# Patient Record
Sex: Female | Born: 1955 | Race: White | Hispanic: No | Smoking: Never smoker
Health system: Southern US, Community
[De-identification: ages and names within clinical notes are randomized; demographics above are authoritative.]

## PROBLEM LIST (undated history)

## (undated) DIAGNOSIS — J45909 Unspecified asthma, uncomplicated: Secondary | ICD-10-CM

## (undated) HISTORY — PX: TONSILLECTOMY: SUR1361

---

## 2019-11-04 ENCOUNTER — Other Ambulatory Visit: Payer: Self-pay

## 2019-11-04 ENCOUNTER — Emergency Department (HOSPITAL_COMMUNITY)
Admission: EM | Admit: 2019-11-04 | Discharge: 2019-11-04 | Disposition: A | Discharge status: Home / Self Care | Payer: BC Managed Care – PPO | Attending: Emergency Medicine | Admitting: Emergency Medicine

## 2019-11-04 ENCOUNTER — Encounter (HOSPITAL_COMMUNITY): Payer: Self-pay

## 2019-11-04 DIAGNOSIS — Y999 Unspecified external cause status: Secondary | ICD-10-CM | POA: Diagnosis not present

## 2019-11-04 DIAGNOSIS — Y9389 Activity, other specified: Secondary | ICD-10-CM | POA: Insufficient documentation

## 2019-11-04 DIAGNOSIS — Y929 Unspecified place or not applicable: Secondary | ICD-10-CM | POA: Diagnosis not present

## 2019-11-04 DIAGNOSIS — W208XXA Other cause of strike by thrown, projected or falling object, initial encounter: Secondary | ICD-10-CM | POA: Diagnosis not present

## 2019-11-04 DIAGNOSIS — R2241 Localized swelling, mass and lump, right lower limb: Secondary | ICD-10-CM | POA: Insufficient documentation

## 2019-11-04 DIAGNOSIS — M79671 Pain in right foot: Secondary | ICD-10-CM | POA: Insufficient documentation

## 2019-11-04 DIAGNOSIS — M79604 Pain in right leg: Secondary | ICD-10-CM | POA: Diagnosis not present

## 2019-11-04 DIAGNOSIS — M7989 Other specified soft tissue disorders: Secondary | ICD-10-CM

## 2019-11-04 HISTORY — DX: Unspecified asthma, uncomplicated: J45.909

## 2019-11-04 LAB — D-DIMER, QUANTITATIVE: D-Dimer, Quant: 0.36 ug/mL-FEU (ref 0.00–0.50)

## 2019-11-04 NOTE — ED Notes (Signed)
Pt verbalizes understanding of DC instructions. Pt belongings returned and is ambulatory out of ED.  

## 2019-11-04 NOTE — ED Triage Notes (Addendum)
Patient arrive POV.   [facility] went to [facility] and had xray and sent to ED to rule out blood clots on right leg with [place].   Pt dropped a box on her right foot last Wednesday. Pt reports swelling to right foot and continued pain. Patient reported a scrap on top of foot as well. Patient reports swelling is now going up her leg.   Pt reports she has been using a support sock with no releif.    Denies using OTC meds.   NKA per pt

## 2019-11-04 NOTE — ED Provider Notes (Signed)
[place] DEPT Provider Note   CSN: [id number] Arrival date & time: 11/04/19  1258     History Chief Complaint  Patient presents with  . Leg Pain    Michelle Werner is a 64 y.o. female.  HPI   Patient presents with concern of right leg swelling, right foot pain.  Patient is generally well, has a history of asthma, but was in her usual state of health until a few days ago.  4 days ago the patient dropped a box on her foot.  She notes that since that time she has had pain across the dorsal surface, midfoot.  She has not taken pain medication, but has noticed increasing swelling since the event.  Initially the swelling was about the foot, but has progressed to include the ankle and lower leg.  No pain in the calf, ankle, no chest pain, no dyspnea. Today, with concern for the swelling, she went to urgent care, there she was evaluated with x-ray which she reports was normal, not demonstrating fracture.  However, with the swelling, concern for DVT she was in here for evaluation.  Past Medical History:  Diagnosis Date  . Asthma     There are no problems to display for this patient.   Past Surgical History:  Procedure Laterality Date  . TONSILLECTOMY       OB History   No obstetric history on file.     No family history on file.  Social History   Tobacco Use  . Smoking status: Never Smoker  . Smokeless tobacco: Never Used  Substance Use Topics  . Alcohol use: Not Currently  . Drug use: Not on file    Home Medications Prior to Admission medications   Medication Sig Start Date End Date Taking? Authorizing Provider  cetirizine (ZYRTEC) 10 MG tablet Take 10 mg by mouth at bedtime.   Yes [provider]    Allergies    Patient has no known allergies.  Review of Systems   Review of Systems  Constitutional:       Per HPI, otherwise negative  HENT:       Per HPI, otherwise negative  Respiratory:       Per HPI, otherwise  negative  Cardiovascular:       Per HPI, otherwise negative  Gastrointestinal: Negative for vomiting.  Endocrine:       Negative aside from HPI  Genitourinary:       Neg aside from HPI   Musculoskeletal:       Per HPI, otherwise negative  Skin: Negative.   Neurological: Negative for syncope.    Physical Exam Updated Vital Signs BP 137/83   Pulse 80   Temp 98.7 F (37.1 C) (Oral)   Resp 18   SpO2 93%   Physical Exam Vitals and nursing note reviewed.  Constitutional:      General: She is not in acute distress.    Appearance: She is well-developed.  HENT:     Head: Normocephalic and atraumatic.  Eyes:     Conjunctiva/sclera: Conjunctivae normal.  Cardiovascular:     Rate and Rhythm: Normal rate and regular rhythm.  Pulmonary:     Effort: Pulmonary effort is normal. No respiratory distress.     Breath sounds: Normal breath sounds. No stridor.  Abdominal:     General: There is no distension.  Musculoskeletal:     Right lower leg: Edema present.       Legs:  Skin:  General: Skin is warm and dry.  Neurological:     Mental Status: She is alert and oriented to person, place, and time.     Cranial Nerves: No cranial nerve deficit.     ED Results / Procedures / Treatments   Labs (all labs ordered are listed, but only abnormal results are displayed) Labs Reviewed  D-DIMER, QUANTITATIVE (NOT AT [facility])     Procedures Procedures (including critical care time)  Medications Ordered in ED Medications - No data to display  ED Course  I have reviewed the triage vital signs and the nursing notes.  Pertinent labs & imaging results that were available during my care of the patient were reviewed by me and considered in my medical decision making (see chart for details).    MDM Rules/Calculators/A&P                      2:11 PM On repeat exam the patient is awake, alert, sitting upright, in no distress.  We discussed lab findings, reassuring with normal D-dimer, and  posttest probability of DVT following this normal result, low risk profile is negligible. We discussed possibilities for swelling including contusion, disruption of venous or lymphatic return or occult fracture of the foot. Patient comfortable with outpatient Ortho follow-up as needed.  With otherwise reassuring exam, labs, vitals, patient discharged in stable condition. Final Clinical Impression(s) / ED Diagnoses Final diagnoses:  Right leg swelling     Gerhard Munch, MD 11/04/19 1413

## 2019-11-04 NOTE — Discharge Instructions (Signed)
As discussed, your evaluation today has been largely reassuring.  But, it is important that you monitor your condition carefully, and do not hesitate to return to the ED if you develop new, or concerning changes in your condition. ? ?Otherwise, please follow-up with your physician for appropriate ongoing care. ? ?

## 2020-03-20 ENCOUNTER — Encounter (HOSPITAL_COMMUNITY): Payer: Self-pay | Admitting: General Surgery

## 2020-03-20 ENCOUNTER — Other Ambulatory Visit: Payer: Self-pay

## 2020-03-20 ENCOUNTER — Ambulatory Visit (HOSPITAL_COMMUNITY)
Admission: RE | Admit: 2020-03-20 | Discharge: 2020-03-20 | Disposition: A | Discharge status: Home / Self Care | Payer: BC Managed Care – PPO | Source: Ambulatory Visit | Attending: General Surgery | Admitting: General Surgery

## 2020-03-20 ENCOUNTER — Ambulatory Visit (HOSPITAL_COMMUNITY): Payer: BC Managed Care – PPO | Admitting: Anesthesiology

## 2020-03-20 ENCOUNTER — Emergency Department (HOSPITAL_COMMUNITY): Payer: PRIVATE HEALTH INSURANCE

## 2020-03-20 ENCOUNTER — Encounter (HOSPITAL_COMMUNITY)
Admission: RE | Disposition: A | Discharge status: Home / Self Care | Payer: Self-pay | Source: Ambulatory Visit | Attending: General Surgery

## 2020-03-20 ENCOUNTER — Emergency Department (HOSPITAL_COMMUNITY)
Admission: EM | Admit: 2020-03-20 | Discharge: 2020-03-20 | Disposition: A | Discharge status: Home / Self Care | Payer: PRIVATE HEALTH INSURANCE | Attending: Emergency Medicine | Admitting: Emergency Medicine

## 2020-03-20 DIAGNOSIS — J45909 Unspecified asthma, uncomplicated: Secondary | ICD-10-CM | POA: Insufficient documentation

## 2020-03-20 DIAGNOSIS — S52532A Colles' fracture of left radius, initial encounter for closed fracture: Secondary | ICD-10-CM | POA: Diagnosis not present

## 2020-03-20 DIAGNOSIS — W19XXXA Unspecified fall, initial encounter: Secondary | ICD-10-CM | POA: Insufficient documentation

## 2020-03-20 DIAGNOSIS — R110 Nausea: Secondary | ICD-10-CM | POA: Diagnosis not present

## 2020-03-20 DIAGNOSIS — Z20822 Contact with and (suspected) exposure to covid-19: Secondary | ICD-10-CM | POA: Insufficient documentation

## 2020-03-20 DIAGNOSIS — Y990 Civilian activity done for income or pay: Secondary | ICD-10-CM | POA: Diagnosis not present

## 2020-03-20 DIAGNOSIS — S52502A Unspecified fracture of the lower end of left radius, initial encounter for closed fracture: Secondary | ICD-10-CM | POA: Diagnosis not present

## 2020-03-20 DIAGNOSIS — S40922A Unspecified superficial injury of left upper arm, initial encounter: Secondary | ICD-10-CM | POA: Diagnosis present

## 2020-03-20 HISTORY — PX: ORIF WRIST FRACTURE: SHX2133

## 2020-03-20 LAB — RESPIRATORY PANEL BY RT PCR (FLU A&B, COVID)
Influenza A by PCR: NEGATIVE
Influenza B by PCR: NEGATIVE
SARS Coronavirus 2 by RT PCR: NEGATIVE

## 2020-03-20 SURGERY — OPEN REDUCTION INTERNAL FIXATION (ORIF) WRIST FRACTURE
Anesthesia: Choice | Site: Wrist | Laterality: Left

## 2020-03-20 SURGERY — OPEN REDUCTION INTERNAL FIXATION (ORIF) WRIST FRACTURE
Anesthesia: General | Site: Wrist | Laterality: Left

## 2020-03-20 MED ORDER — FENTANYL CITRATE (PF) 100 MCG/2ML IJ SOLN
INTRAMUSCULAR | Status: AC
  Administered 2020-03-20: 50 ug via INTRAVENOUS
  Filled 2020-03-20: qty 2

## 2020-03-20 MED ORDER — MIDAZOLAM HCL 2 MG/2ML IJ SOLN
INTRAMUSCULAR | Status: AC
  Filled 2020-03-20: qty 2

## 2020-03-20 MED ORDER — POVIDONE-IODINE 10 % EX SWAB: 2.0000 "application " | Freq: Once | CUTANEOUS | Status: DC

## 2020-03-20 MED ORDER — ONDANSETRON HCL 4 MG/2ML IJ SOLN
INTRAMUSCULAR | Status: AC
  Filled 2020-03-20: qty 2

## 2020-03-20 MED ORDER — DEXAMETHASONE SODIUM PHOSPHATE 10 MG/ML IJ SOLN
INTRAMUSCULAR | Status: DC | PRN
  Administered 2020-03-20: 4 mg via INTRAVENOUS

## 2020-03-20 MED ORDER — FENTANYL CITRATE (PF) 250 MCG/5ML IJ SOLN
INTRAMUSCULAR | Status: AC
Start: 2020-03-20 — End: ?
  Filled 2020-03-20: qty 5

## 2020-03-20 MED ORDER — 0.9 % SODIUM CHLORIDE (POUR BTL) OPTIME
TOPICAL | Status: DC | PRN
  Administered 2020-03-20: 1000 mL

## 2020-03-20 MED ORDER — MIDAZOLAM HCL 2 MG/2ML IJ SOLN
INTRAMUSCULAR | Status: AC
  Administered 2020-03-20: 1 mg via INTRAVENOUS
  Filled 2020-03-20: qty 2

## 2020-03-20 MED ORDER — FENTANYL CITRATE (PF) 100 MCG/2ML IJ SOLN
50.0000 ug | Freq: Once | INTRAMUSCULAR | Status: AC
  Administered 2020-03-20: 50 ug via INTRAVENOUS
  Filled 2020-03-20: qty 2

## 2020-03-20 MED ORDER — FENTANYL CITRATE (PF) 100 MCG/2ML IJ SOLN: 50.0000 ug | Freq: Once | INTRAMUSCULAR | Status: AC

## 2020-03-20 MED ORDER — BUPIVACAINE HCL (PF) 0.25 % IJ SOLN
INTRAMUSCULAR | Status: AC
  Filled 2020-03-20: qty 30

## 2020-03-20 MED ORDER — ONDANSETRON HCL 4 MG/2ML IJ SOLN
4.0000 mg | Freq: Once | INTRAMUSCULAR | Status: AC
  Administered 2020-03-20: 4 mg via INTRAVENOUS
  Filled 2020-03-20: qty 2

## 2020-03-20 MED ORDER — CEPHALEXIN 500 MG PO CAPS: 500.0000 mg | ORAL_CAPSULE | Freq: Four times a day (QID) | ORAL | 0 refills | Status: DC

## 2020-03-20 MED ORDER — LIDOCAINE 2% (20 MG/ML) 5 ML SYRINGE
INTRAMUSCULAR | Status: DC | PRN
  Administered 2020-03-20: 60 mg via INTRAVENOUS

## 2020-03-20 MED ORDER — CHLORHEXIDINE GLUCONATE 0.12 % MT SOLN: 15.0000 mL | OROMUCOSAL | Status: AC

## 2020-03-20 MED ORDER — LIDOCAINE 2% (20 MG/ML) 5 ML SYRINGE
INTRAMUSCULAR | Status: AC
  Filled 2020-03-20: qty 5

## 2020-03-20 MED ORDER — HYDROCODONE-ACETAMINOPHEN 5-325 MG PO TABS
1.0000 | ORAL_TABLET | Freq: Four times a day (QID) | ORAL | 0 refills | Status: AC | PRN
Start: 2020-03-20 — End: 2020-03-27

## 2020-03-20 MED ORDER — MIDAZOLAM HCL 2 MG/2ML IJ SOLN: 1.0000 mg | Freq: Once | INTRAMUSCULAR | Status: AC

## 2020-03-20 MED ORDER — ONDANSETRON HCL 4 MG/2ML IJ SOLN
INTRAMUSCULAR | Status: DC | PRN
  Administered 2020-03-20: 4 mg via INTRAVENOUS

## 2020-03-20 MED ORDER — FENTANYL CITRATE (PF) 100 MCG/2ML IJ SOLN
75.0000 ug | Freq: Once | INTRAMUSCULAR | Status: AC
  Administered 2020-03-20: 75 ug via INTRAVENOUS
  Filled 2020-03-20: qty 2

## 2020-03-20 MED ORDER — PROPOFOL 10 MG/ML IV BOLUS
INTRAVENOUS | Status: AC
  Filled 2020-03-20: qty 20

## 2020-03-20 MED ORDER — DEXAMETHASONE SODIUM PHOSPHATE 10 MG/ML IJ SOLN
INTRAMUSCULAR | Status: AC
  Filled 2020-03-20: qty 1

## 2020-03-20 MED ORDER — ROPIVACAINE HCL 7.5 MG/ML IJ SOLN
INTRAMUSCULAR | Status: DC | PRN
  Administered 2020-03-20: 20 mL via PERINEURAL

## 2020-03-20 MED ORDER — CHLORHEXIDINE GLUCONATE 0.12 % MT SOLN
OROMUCOSAL | Status: AC
  Administered 2020-03-20: 15 mL via OROMUCOSAL
  Filled 2020-03-20: qty 15

## 2020-03-20 MED ORDER — PROPOFOL 10 MG/ML IV BOLUS
INTRAVENOUS | Status: DC | PRN
  Administered 2020-03-20: 150 mg via INTRAVENOUS

## 2020-03-20 MED ORDER — CHLORHEXIDINE GLUCONATE 4 % EX LIQD: 60.0000 mL | Freq: Once | CUTANEOUS | Status: DC

## 2020-03-20 MED ORDER — CEFAZOLIN SODIUM-DEXTROSE 2-4 GM/100ML-% IV SOLN
2.0000 g | INTRAVENOUS | Status: AC
  Administered 2020-03-20: 2 g via INTRAVENOUS

## 2020-03-20 MED ORDER — CEFAZOLIN SODIUM-DEXTROSE 2-4 GM/100ML-% IV SOLN
INTRAVENOUS | Status: AC
  Filled 2020-03-20: qty 100

## 2020-03-20 MED ORDER — LACTATED RINGERS IV SOLN: INTRAVENOUS | Status: DC

## 2020-03-20 SURGICAL SUPPLY — 56 items
BIT DRILL 2.0 LNG QUCK RELEASE (BIT) ×1 IMPLANT
BIT DRILL QC 2.8X5 (BIT) ×2 IMPLANT
BNDG ELASTIC 3X5.8 VLCR STR LF (GAUZE/BANDAGES/DRESSINGS) ×2 IMPLANT
BNDG ELASTIC 4X5.8 VLCR STR LF (GAUZE/BANDAGES/DRESSINGS) ×2 IMPLANT
BNDG ESMARK 4X9 LF (GAUZE/BANDAGES/DRESSINGS) IMPLANT
BNDG GAUZE ELAST 4 BULKY (GAUZE/BANDAGES/DRESSINGS) ×2 IMPLANT
CANISTER SUCT 3000ML PPV (MISCELLANEOUS) ×2 IMPLANT
CHLORAPREP W/TINT 26 (MISCELLANEOUS) ×2 IMPLANT
CORD BIPOLAR FORCEPS 12FT (ELECTRODE) ×2 IMPLANT
COVER SURGICAL LIGHT HANDLE (MISCELLANEOUS) ×2 IMPLANT
COVER WAND RF STERILE (DRAPES) ×2 IMPLANT
CUFF TOURN SGL QUICK 18X4 (TOURNIQUET CUFF) ×2 IMPLANT
CUFF TOURN SGL QUICK 24 (TOURNIQUET CUFF)
CUFF TRNQT CYL 24X4X16.5-23 (TOURNIQUET CUFF) IMPLANT
DRAIN TLS ROUND 10FR (DRAIN) IMPLANT
DRAPE OEC MINIVIEW 54X84 (DRAPES) ×2 IMPLANT
DRAPE SURG 17X23 STRL (DRAPES) ×2 IMPLANT
DRILL 2.0 LNG QUICK RELEASE (BIT) ×2
GAUZE XEROFORM 1X8 LF (GAUZE/BANDAGES/DRESSINGS) ×2 IMPLANT
GLOVE BIOGEL M 8.0 STRL (GLOVE) ×2 IMPLANT
GOWN STRL REUS W/ TWL XL LVL3 (GOWN DISPOSABLE) ×2 IMPLANT
GOWN STRL REUS W/TWL XL LVL3 (GOWN DISPOSABLE) ×2
GUIDEWIRE ORTHO 0.054X6 (WIRE) ×4 IMPLANT
KIT BASIN OR (CUSTOM PROCEDURE TRAY) ×2 IMPLANT
NEEDLE HYPO 22GX1.5 SAFETY (NEEDLE) IMPLANT
NS IRRIG 1000ML POUR BTL (IV SOLUTION) ×2 IMPLANT
PACK ORTHO EXTREMITY (CUSTOM PROCEDURE TRAY) ×2 IMPLANT
PAD CAST 3X4 CTTN HI CHSV (CAST SUPPLIES) IMPLANT
PAD CAST 4YDX4 CTTN HI CHSV (CAST SUPPLIES) ×1 IMPLANT
PADDING CAST ABS 4INX4YD NS (CAST SUPPLIES) ×1
PADDING CAST ABS COTTON 4X4 ST (CAST SUPPLIES) ×1 IMPLANT
PADDING CAST COTTON 3X4 STRL (CAST SUPPLIES)
PADDING CAST COTTON 4X4 STRL (CAST SUPPLIES) ×1
PLATE LEFT DIST RADIUS NARROW (Plate) ×2 IMPLANT
SCREW BN FT 16X2.3XLCK HEX CRT (Screw) ×1 IMPLANT
SCREW CORT FT 18X2.3XLCK HEX (Screw) ×4 IMPLANT
SCREW CORT FT 20X2.3XLCK HEX (Screw) ×1 IMPLANT
SCREW CORTICAL LOCKING 2.3X16M (Screw) ×1 IMPLANT
SCREW CORTICAL LOCKING 2.3X18M (Screw) ×4 IMPLANT
SCREW CORTICAL LOCKING 2.3X20M (Screw) ×1 IMPLANT
SCREW HEXALOBE NON-LOCK 3.5X14 (Screw) ×6 IMPLANT
SPLINT PLASTER CAST XFAST 4X15 (CAST SUPPLIES) IMPLANT
SPLINT PLASTER XTRA FAST SET 4 (CAST SUPPLIES)
SUT ETHILON 3 0 PS 1 (SUTURE) ×2 IMPLANT
SUT ETHILON 4 0 PS 2 18 (SUTURE) ×2 IMPLANT
SUT MNCRL AB 4-0 PS2 18 (SUTURE) ×2 IMPLANT
SUT VIC AB 3-0 PS1 18 (SUTURE) ×2
SUT VIC AB 3-0 PS1 18X BRD (SUTURE) ×1 IMPLANT
SUT VIC AB 3-0 PS1 18XBRD (SUTURE) ×1 IMPLANT
SUT VICRYL 4-0 PS2 18IN ABS (SUTURE) ×2 IMPLANT
SYR BULB EAR ULCER 3OZ GRN STR (SYRINGE) ×2 IMPLANT
SYR CONTROL 10ML LL (SYRINGE) IMPLANT
TOWEL GREEN STERILE FF (TOWEL DISPOSABLE) ×2 IMPLANT
TUBE CONNECTING 20X1/4 (TUBING) ×2 IMPLANT
UNDERPAD 30X36 HEAVY ABSORB (UNDERPADS AND DIAPERS) ×2 IMPLANT
WATER STERILE IRR 1000ML POUR (IV SOLUTION) ×2 IMPLANT

## 2020-03-20 NOTE — Consult Note (Deleted)
  The note originally documented on this encounter has been moved the the encounter in which it belongs.  

## 2020-03-20 NOTE — Anesthesia Procedure Notes (Signed)
Anesthesia Regional Block: Supraclavicular block   Pre-Anesthetic Checklist: ,, timeout performed, Correct Patient, Correct Site, Correct Laterality, Correct Procedure, Correct Position, site marked, Risks and benefits discussed,  Surgical consent,  Pre-op evaluation,  At surgeon's request and post-op pain management  Laterality: Left  Prep: chloraprep       Needles:  Injection technique: Single-shot  Needle Type: Echogenic Needle     Needle Length: 5cm  Needle Gauge: 21     Additional Needles:   Narrative:  Start time: 03/20/2020 3:40 PM End time: 03/20/2020 3:43 PM Injection made incrementally with aspirations every 5 mL.  Performed by: Personally  Anesthesiologist: Beryle Lathe, MD  Additional Notes: No pain on injection. No increased resistance to injection. Injection made in 5cc increments. Good needle visualization. Patient tolerated the procedure well.

## 2020-03-20 NOTE — ED Provider Notes (Signed)
[facility]-EMERGENCY DEPT Provider Note   CSN: [id number] Arrival date & time: 03/20/20  9924     History Chief Complaint  Patient presents with  . Fall  . Wrist Pain    left    Michelle Werner is a 64 y.o. female with no pertinent past medical history presents the emergency department today for fall.  Patient states that she was pushing cart at work at [employer], she had mechanical fall and fell on her wrist.  Has never broken her wrist before.  States that she is generally healthy.  No blood thinners.  States that she is having pain in her wrist that radiates down into her fingers.  Mild numbness and tingling into her fingers.  Arrived by EMS, they were able to splint this.  States that pain is an 8/10.  Admits to some nausea.  No vomiting.  States that she was in normal health yesterday.  Did not hit her head, no headache, loss of consciousness.  No neck pain.  Does not take any medications currently.  HPI     Past Medical History:  Diagnosis Date  . Asthma     There are no problems to display for this patient.   Past Surgical History:  Procedure Laterality Date  . TONSILLECTOMY       OB History   No obstetric history on file.     No family history on file.  Social History   Tobacco Use  . Smoking status: Never Smoker  . Smokeless tobacco: Never Used  Substance Use Topics  . Alcohol use: Not Currently  . Drug use: Not on file    Home Medications Prior to Admission medications   Medication Sig Start Date End Date Taking? Authorizing Provider  levocetirizine (XYZAL) 5 MG tablet Take 5 mg by mouth at bedtime. 02/14/20  Yes [provider]    Allergies    Patient has no known allergies.  Review of Systems   Review of Systems  Constitutional: Negative for chills, diaphoresis, fatigue and fever.  HENT: Negative for congestion, sore throat and trouble swallowing.   Eyes: Negative for pain and visual disturbance.  Respiratory:  Negative for cough, shortness of breath and wheezing.   Cardiovascular: Negative for chest pain, palpitations and leg swelling.  Gastrointestinal: Positive for nausea. Negative for abdominal distention, abdominal pain, diarrhea and vomiting.  Genitourinary: Negative for difficulty urinating.  Musculoskeletal: Positive for arthralgias. Negative for back pain, neck pain and neck stiffness.  Skin: Negative for pallor.  Neurological: Negative for dizziness, speech difficulty, weakness and headaches.  Psychiatric/Behavioral: Negative for confusion.    Physical Exam Updated Vital Signs BP 135/81 (BP Location: Right Arm)   Pulse 70   Temp 97.7 F (36.5 C) (Oral)   Resp (!) 24   Ht 5\' 4"  (1.626 m)   Wt 68 kg   SpO2 99%   BMI 25.75 kg/m   Physical Exam Constitutional:      General: She is not in acute distress.    Appearance: Normal appearance. She is not ill-appearing, toxic-appearing or diaphoretic.  HENT:     Mouth/Throat:     Mouth: Mucous membranes are moist.     Pharynx: Oropharynx is clear.  Eyes:     General: No scleral icterus.    Extraocular Movements: Extraocular movements intact.     Pupils: Pupils are equal, round, and reactive to light.  Cardiovascular:     Rate and Rhythm: Normal rate and regular rhythm.  Pulses: Normal pulses.     Heart sounds: Normal heart sounds.  Pulmonary:     Effort: Pulmonary effort is normal. No respiratory distress.     Breath sounds: Normal breath sounds. No stridor. No wheezing, rhonchi or rales.  Chest:     Chest wall: No tenderness.  Abdominal:     General: Abdomen is flat. There is no distension.     Palpations: Abdomen is soft.     Tenderness: There is no abdominal tenderness. There is no guarding or rebound.  Musculoskeletal:        General: No swelling or tenderness. Normal range of motion.     Cervical back: Normal range of motion and neck supple. No rigidity.     Right lower leg: No edema.     Left lower leg: No edema.       Comments: Patient with obvious deformity to left wrist, no open fractures noted.  No overlying erythema, warmth or ecchymosis.  Patient unable to move wrist limited to pain.  Normal sensation of wrist and fingers.  Normal strength of fingers.  Radial pulse 2+.  Normal cap refill.  Skin:    General: Skin is warm and dry.     Capillary Refill: Capillary refill takes less than 2 seconds.     Coloration: Skin is not pale.  Neurological:     General: No focal deficit present.     Mental Status: She is alert and oriented to person, place, and time.  Psychiatric:        Mood and Affect: Mood normal.        Behavior: Behavior normal.     ED Results / Procedures / Treatments   Labs (all labs ordered are listed, but only abnormal results are displayed) Labs Reviewed  RESPIRATORY PANEL BY RT PCR (FLU A&B, COVID)    EKG None  Radiology DG Forearm Left  Result Date: 03/20/2020 CLINICAL DATA:  Fall, wrist deformity EXAM: LEFT FOREARM - 2 VIEW COMPARISON:  Wrist series today FINDINGS: There is a comminuted fracture through the distal left radius with posterior displacement and angulation. No visible ulnar abnormality. No subluxation or dislocation. IMPRESSION: Comminuted, posteriorly displaced and angulated distal left radial metaphyseal fracture. Electronically Signed   By: Charlett Nose M.D.   On: 03/20/2020 11:09   DG Wrist Complete Left  Result Date: 03/20/2020 CLINICAL DATA:  Fall, wrist deformity EXAM: LEFT WRIST - COMPLETE 3+ VIEW COMPARISON:  Forearm today FINDINGS: There is a comminuted distal left radial fracture. Posterior angulation and displacement of fracture fragments. No subluxation or dislocation. No acute ulnar abnormality. Rounded bone density adjacent to the ulnar styloid is well corticated and likely related to old injury. IMPRESSION: Comminuted, posteriorly displaced and angulated distal left radial fracture. Electronically Signed   By: Charlett Nose M.D.   On: 03/20/2020  11:09    Procedures Procedures (including critical care time)  Medications Ordered in ED Medications  fentaNYL (SUBLIMAZE) injection 75 mcg (75 mcg Intravenous Given 03/20/20 1034)  ondansetron (ZOFRAN) injection 4 mg (4 mg Intravenous Given 03/20/20 1035)  fentaNYL (SUBLIMAZE) injection 50 mcg (50 mcg Intravenous Given 03/20/20 1307)    ED Course  I have reviewed the triage vital signs and the nursing notes.  Pertinent labs & imaging results that were available during my care of the patient were reviewed by me and considered in my medical decision making (see chart for details).    MDM Rules/Calculators/A&P  Michelle Werner is a 64 y.o. female with no pertinent past medical history presents the emergency department today for mechanical fall.  Did not hit her head, no loss of conscious.  Obvious deformity to left wrist, patient is distally neurovascularly intact.  Will order plain films and pain control this time.  Plain films with comminuted displaced and angulated distal left radial fracture.  Appears has left wrist has been completely shattered, will call hand at this time.  Closed fracture.  Spoke to Earney Hamburg, PA-C who spoke to Dr. Izora Ribas who states that they will do surgery on this patient for today.  States that this can be technically an outpatient surgery, to discharge from [facility] and to have patient come to [facility] around 4 as outpatient.  Earney Hamburg is currently speaking to patient in the room.  Patient has been splinted appropriately with sugar tong splint, pain under control and to be discharged to have surgery later today.  Patient is agreeable with this plan.  Patient states daughter can drive her.  Last time she ate something was at 530 this morning.  Did advise patient to not eat or drink anything until after the surgery, patient agreeable.  Patient appears extremely reasonable.  Doubt need for further emergent work up at this time.  I explained the diagnosis and have given explicit precautions to return to the ER including for any other new or worsening symptoms. The patient understands and accepts the medical plan as it's been dictated and I have answered their questions.   I discussed this case with my attending physician who cosigned this note including patient's presenting symptoms, physical exam, and planned diagnostics and interventions. Attending physician stated agreement with plan or made changes to plan which were implemented.   Attending physician assessed patient at bedside.  Final Clinical Impression(s) / ED Diagnoses Final diagnoses:  Closed Colles' fracture of left radius, initial encounter    Rx / DC Orders ED Discharge Orders    None       Farrel Gordon, PA-C 03/20/20 1442    Wynetta Fines, MD 03/23/20 1700

## 2020-03-20 NOTE — ED Notes (Signed)
Notified daughter about d/c to [facility] for surgery. She said she is coming ASAP

## 2020-03-20 NOTE — Discharge Instructions (Addendum)
Please go to [facility], have someone drive you at 703, they will do your surgery around 4 PM.  Please go to the admitting area at [facility] to check in.  Do not eat or drink anything on the way.  Please follow Casimiro Needle Jeffrey's instructions.  You can use the attached instructions.  They will give you information and instructions post surgery.  Please come back to the emerge department for any new worsening concerning symptoms.  Do not drive.

## 2020-03-20 NOTE — Anesthesia Procedure Notes (Addendum)
Procedure Name: LMA Insertion Date/Time: 03/20/2020 4:30 PM Performed by: Shireen Quan, CRNA Pre-anesthesia Checklist: Patient identified, Emergency Drugs available, Suction available and Patient being monitored Patient Re-evaluated:Patient Re-evaluated prior to induction Oxygen Delivery Method: Circle System Utilized Preoxygenation: Pre-oxygenation with 100% oxygen Induction Type: IV induction Ventilation: Mask ventilation without difficulty LMA: LMA inserted LMA Size: 4.0 Number of attempts: 1 Airway Equipment and Method: Bite block Placement Confirmation: positive ETCO2 Tube secured with: Tape Dental Injury: Teeth and Oropharynx as per pre-operative assessment

## 2020-03-20 NOTE — Consult Note (Signed)
Reason for Consult:Left wrist fx Referring Physician: P Messick  Michelle Werner is an 64 y.o. female.  HPI: Michelle Werner was working at [employer] when they struck a curb. This caused her to lose her balance and she fell backwards onto her left outstretched hand. She had immediate left wrist pain. She was brought to the ED where x-rays showed a distal radius fx and hand surgery was consulted. She is RHD.  Past Medical History:  Diagnosis Date  . Asthma     Past Surgical History:  Procedure Laterality Date  . TONSILLECTOMY      No family history on file.  Social History:  reports that she has never smoked. She has never used smokeless tobacco. She reports previous alcohol use. No history on file for drug use.  Allergies: No Known Allergies  Medications: I have reviewed the patient's current medications.  No results found for this or any previous visit (from the past 48 hour(s)).  DG Forearm Left  Result Date: 03/20/2020 CLINICAL DATA:  Fall, wrist deformity EXAM: LEFT FOREARM - 2 VIEW COMPARISON:  Wrist series today FINDINGS: There is a comminuted fracture through the distal left radius with posterior displacement and angulation. No visible ulnar abnormality. No subluxation or dislocation. IMPRESSION: Comminuted, posteriorly displaced and angulated distal left radial metaphyseal fracture. Electronically Signed   By: Charlett Nose M.D.   On: 03/20/2020 11:09   DG Wrist Complete Left  Result Date: 03/20/2020 CLINICAL DATA:  Fall, wrist deformity EXAM: LEFT WRIST - COMPLETE 3+ VIEW COMPARISON:  Forearm today FINDINGS: There is a comminuted distal left radial fracture. Posterior angulation and displacement of fracture fragments. No subluxation or dislocation. No acute ulnar abnormality. Rounded bone density adjacent to the ulnar styloid is well corticated and likely related to old injury. IMPRESSION: Comminuted, posteriorly displaced and angulated distal left radial fracture.  Electronically Signed   By: Charlett Nose M.D.   On: 03/20/2020 11:09    Review of Systems  HENT: Negative for ear discharge, ear pain, hearing loss and tinnitus.   Eyes: Negative for photophobia and pain.  Respiratory: Negative for cough and shortness of breath.   Cardiovascular: Negative for chest pain.  Gastrointestinal: Negative for abdominal pain, nausea and vomiting.  Genitourinary: Negative for dysuria, flank pain, frequency and urgency.  Musculoskeletal: Positive for arthralgias (Left wrist). Negative for back pain, myalgias and neck pain.  Neurological: Negative for dizziness and headaches.  Hematological: Does not bruise/bleed easily.  Psychiatric/Behavioral: The patient is not nervous/anxious.    Blood pressure 123/73, pulse 70, temperature 97.8 F (36.6 C), temperature source Oral, resp. rate 19, height 5\' 4"  (1.626 m), weight 68 kg, SpO2 97 %. Physical Exam Constitutional:      General: She is not in acute distress.    Appearance: She is well-developed. She is not diaphoretic.  HENT:     Head: Normocephalic and atraumatic.  Eyes:     General: No scleral icterus.       Right eye: No discharge.        Left eye: No discharge.     Conjunctiva/sclera: Conjunctivae normal.  Cardiovascular:     Rate and Rhythm: Normal rate and regular rhythm.  Pulmonary:     Effort: Pulmonary effort is normal. No respiratory distress.  Musculoskeletal:     Cervical back: Normal range of motion.     Comments: Left shoulder, elbow, wrist, digits- no skin wounds, sugar tong splint in place, no instability, no blocks to motion  Sens  Ax/R/M/U intact  Mot   Ax/ R/ PIN/ M/ AIN/ U grossly intact  Fingers perfused  Skin:    General: Skin is warm and dry.  Neurological:     Mental Status: She is alert.  Psychiatric:        Behavior: Behavior normal.     Assessment/Plan: Left wrist fx -- Plan ORIF today by Dr. Izora Ribas at [facility]. Please keep NPO. Pt's daughter will transport to [facility]. Anticipate  discharge after surgery. Asthma    Freeman Caldron, PA-C Orthopedic Surgery [phone] 03/20/2020, 1:31 PM

## 2020-03-20 NOTE — Anesthesia Preprocedure Evaluation (Addendum)
Anesthesia Evaluation  Patient identified by MRN, date of birth, ID band Patient awake    Reviewed: Allergy & Precautions, NPO status , Patient's Chart, lab work & pertinent test results  History of Anesthesia Complications Negative for: history of anesthetic complications  Airway Mallampati: III  TM Distance: >3 FB Neck ROM: Full    Dental  (+) Dental Advisory Given   Pulmonary asthma ,    Pulmonary exam normal        Cardiovascular negative cardio ROS Normal cardiovascular exam     Neuro/Psych negative neurological ROS  negative psych ROS   GI/Hepatic negative GI ROS, Neg liver ROS,   Endo/Other  negative endocrine ROS  Renal/GU negative Renal ROS     Musculoskeletal negative musculoskeletal ROS (+)   Abdominal   Peds  Hematology negative hematology ROS (+)   Anesthesia Other Findings Covid test negative   Reproductive/Obstetrics                            Anesthesia Physical Anesthesia Plan  ASA: II  Anesthesia Plan: General   Post-op Pain Management:  Regional for Post-op pain   Induction: Intravenous  PONV Risk Score and Plan: 3 and Treatment may vary due to age or medical condition, Ondansetron, Dexamethasone and Midazolam  Airway Management Planned: LMA  Additional Equipment: None  Intra-op Plan:   Post-operative Plan: Extubation in OR  Informed Consent: I have reviewed the patients History and Physical, chart, labs and discussed the procedure including the risks, benefits and alternatives for the proposed anesthesia with the patient or authorized representative who has indicated his/her understanding and acceptance.     Dental advisory given  Plan Discussed with: CRNA and Anesthesiologist  Anesthesia Plan Comments:        Anesthesia Quick Evaluation

## 2020-03-20 NOTE — Transfer of Care (Signed)
Immediate Anesthesia Transfer of Care Note  Patient: Michelle Werner  Procedure(s) Performed: OPEN REDUCTION INTERNAL FIXATION (ORIF) WRIST FRACTURE (Left Wrist)  Patient Location: PACU  Anesthesia Type:GA combined with regional for post-op pain  Level of Consciousness: awake and patient cooperative  Airway & Oxygen Therapy: Patient Spontanous Breathing and Patient connected to nasal cannula oxygen  Post-op Assessment: Report given to RN, Post -op Vital signs reviewed and stable and Patient moving all extremities  Post vital signs: Reviewed and stable  Last Vitals:  Vitals Value Taken Time  BP 134/77 03/20/20 1727  Temp    Pulse 70 03/20/20 1729  Resp 13 03/20/20 1729  SpO2 100 % 03/20/20 1729  Vitals shown include unvalidated device data.  Last Pain:  Vitals:   03/20/20 1556  TempSrc:   PainSc: 0-No pain         Complications: No complications documented.

## 2020-03-20 NOTE — Discharge Instructions (Signed)
Discharge Instructions:  Keep your dressing clean, dry and in place until instructed to remove by Dr. .  If the dressing becomes dirty or wet call the office for instructions during business hours. Elevate the extremity to help with swelling, this will also help with any discomfort. Take your medication as prescribed. No lifting with the injured  extremity. If you feel that the dressing is too tight, you may loosen it, but keep it on; finger tips should be pink; if there is a concern, call the office. [phone] Ice may be used if the injury is a fracture, do not apply ice directly to the skin. Please call the office on the next business day after discharge to arrange a follow up appointment.  Call [phone] between the hours of 9am - 5pm M-Th or 9am - 1pm on Fri. For most hand injuries and/or conditions, you may return to work using the uninjured hand (one handed duty) within 24-72 hours.  A detailed note will be provided to you at your follow up appointment or may contact the office prior to your follow up.    

## 2020-03-20 NOTE — ED Notes (Signed)
I updated her daughter on the plan of care at this point, pending x ray results.

## 2020-03-20 NOTE — Op Note (Signed)
NAME: Michelle Werner, HEAD MEDICAL RECORD LX:[id number] ACCOUNT [id number] DATE OF BIRTH:01-03-1956 FACILITY: [facility] LOCATION: [facility] PHYSICIAN: C. , MD  OPERATIVE REPORT  DATE OF PROCEDURE:  03/20/2020  PREOPERATIVE DIAGNOSIS:  Closed displaced fracture of her left distal radius.  POSTOPERATIVE DIAGNOSIS:  Closed displaced fracture of her left distal radius.  PROCEDURE:  Open reduction internal fixation of the left distal radius fracture with an Acumed volar plate.  SURGEON:  Knute Neu, MD  INDICATIONS:  The patient is a 64 year old female who fell at work onto an outstretched hand sustaining a fracture to her left wrist.  She was seen and evaluated.  Surgical intervention was recommended.  Risks, benefits and alternatives of surgery were  discussed with her.  She agreed with this course of action.  Consent was obtained.  DESCRIPTION OF PROCEDURE:  The patient was taken to the operating room and placed supine on the operating table.  Anesthesia was administered.  Prior to coming back to the operating room, the anesthesia service provided an upper extremity block.  The arm  was prepped and draped in normal sterile fashion.  A tourniquet was used on the upper arm, exsanguinated and inflated to 250 mmHg.  An incision was made on the volar wrist overlying the FCR tendon.  Dissection was carried down through the fascia to the  pronator quadratus muscle.  The muscle was taken down in a hockey stick type fashion, exposing the fracture site.  The fracture site was comminuted and distal.  Adequate exposure was obtained and an appropriate sized Acumed volar plate was chosen,  temporarily held in place with K-wires where x-ray revealed a near anatomic reduction and good plate placement.  Afterwards each of the 4 radial shaft screws were each drilled, measured and appropriate sized cortical screws were placed.  Afterwards, each  of the distal radial screws were each drilled, measured and  appropriate sized screws placed.  Afterwards several x-ray views, both AP, lateral and tangential revealed near anatomic reduction, good plate placement with good screw length.  No screws were  in the joint.  Afterwards, the wound was irrigated with irrigation solution.  The deep fascia was closed with interrupted 3-0 Vicryl.  The subcutaneous tissues were closed with 3-0 Vicryl and the skin was closed with a running 4-0 Monocryl.  Xeroform  gauze and a sterile dressing as well as a volar splint were placed.  After the tourniquet was released, the fingers returned to a nice pink color.  VN/NUANCE  D:03/20/2020 T:03/20/2020 JOB:[id number]

## 2020-03-20 NOTE — ED Triage Notes (Signed)
Pt came from work via EMS. C/c: fall with left wrist pain Fell backwards on Left wrist d/t kickback from shopping carts. Broke her fall with the left hand. Pain 8/10 in left wrist.  Denies back or neck pain.  Neuro check normal, peripheral pulses present. A&O x4

## 2020-03-20 NOTE — ED Notes (Signed)
Leaving 20 g IV in Right AC for surgery at [facility] at time of d/c from [facility] ED. Going to wrap it in [place] for travel to [facility]. Instructed pt to not do anything to the IV or mess with it at all

## 2020-03-24 ENCOUNTER — Encounter (HOSPITAL_COMMUNITY): Payer: Self-pay | Admitting: General Surgery

## 2020-03-24 NOTE — Anesthesia Postprocedure Evaluation (Signed)
Anesthesia Post Note  Patient: Michelle Werner  Procedure(s) Performed: OPEN REDUCTION INTERNAL FIXATION (ORIF) WRIST FRACTURE (Left Wrist)     Patient location during evaluation: PACU Anesthesia Type: General Level of consciousness: awake and alert Pain management: pain level controlled Vital Signs Assessment: post-procedure vital signs reviewed and stable Respiratory status: spontaneous breathing, nonlabored ventilation and respiratory function stable Cardiovascular status: blood pressure returned to baseline and stable Postop Assessment: no apparent nausea or vomiting Anesthetic complications: no   No complications documented.  Last Vitals:  Vitals:   03/20/20 1745 03/20/20 1800  BP: 123/75 124/79  Pulse: 70 81  Resp: 15 14  Temp:  36.7 C  SpO2: 99% 98%    Last Pain:  Vitals:   03/20/20 1800  TempSrc:   PainSc: 0-No pain                 Beryle Lathe

## 2020-06-13 ENCOUNTER — Emergency Department (HOSPITAL_COMMUNITY): Payer: BC Managed Care – PPO

## 2020-06-13 ENCOUNTER — Emergency Department (HOSPITAL_COMMUNITY)
Admission: EM | Admit: 2020-06-13 | Discharge: 2020-06-13 | Disposition: A | Discharge status: Home / Self Care | Payer: BC Managed Care – PPO | Attending: Emergency Medicine | Admitting: Emergency Medicine

## 2020-06-13 ENCOUNTER — Other Ambulatory Visit: Payer: Self-pay

## 2020-06-13 DIAGNOSIS — Z20822 Contact with and (suspected) exposure to covid-19: Secondary | ICD-10-CM | POA: Insufficient documentation

## 2020-06-13 DIAGNOSIS — R0602 Shortness of breath: Secondary | ICD-10-CM | POA: Diagnosis present

## 2020-06-13 DIAGNOSIS — J4521 Mild intermittent asthma with (acute) exacerbation: Secondary | ICD-10-CM

## 2020-06-13 LAB — BASIC METABOLIC PANEL
Anion gap: 13 (ref 5–15)
BUN: 15 mg/dL (ref 8–23)
CO2: 21 mmol/L — ABNORMAL LOW (ref 22–32)
Calcium: 9.6 mg/dL (ref 8.9–10.3)
Chloride: 106 mmol/L (ref 98–111)
Creatinine, Ser: 0.64 mg/dL (ref 0.44–1.00)
GFR, Estimated: >60 mL/min (ref >60)
Glucose, Bld: 111 mg/dL — ABNORMAL HIGH (ref 70–99)
Potassium: 3.9 mmol/L (ref 3.5–5.1)
Sodium: 140 mmol/L (ref 135–145)

## 2020-06-13 LAB — CBC
HCT: 38 % (ref 36.0–46.0)
Hemoglobin: 13.3 g/dL (ref 12.0–15.0)
MCH: 30.1 pg (ref 26.0–34.0)
MCHC: 35 g/dL (ref 30.0–36.0)
MCV: 86 fL (ref 80.0–100.0)
Platelets: 248 10*3/uL (ref 150–400)
RBC: 4.42 MIL/uL (ref 3.87–5.11)
RDW: 13.5 % (ref 11.5–15.5)
WBC: 7.5 10*3/uL (ref 4.0–10.5)
nRBC: 0 % (ref 0.0–0.2)

## 2020-06-13 LAB — RESP PANEL BY RT-PCR (FLU A&B, COVID) ARPGX2
Influenza A by PCR: NEGATIVE
Influenza B by PCR: NEGATIVE
SARS Coronavirus 2 by RT PCR: NEGATIVE

## 2020-06-13 MED ORDER — ALBUTEROL SULFATE HFA 108 (90 BASE) MCG/ACT IN AERS
2.0000 | INHALATION_SPRAY | RESPIRATORY_TRACT | Status: DC | PRN
  Administered 2020-06-13: 08:00:00 2 via RESPIRATORY_TRACT

## 2020-06-13 MED ORDER — IPRATROPIUM-ALBUTEROL 0.5-2.5 (3) MG/3ML IN SOLN
3.0000 mL | Freq: Once | RESPIRATORY_TRACT | Status: AC
  Administered 2020-06-13: 14:00:00 3 mL via RESPIRATORY_TRACT
  Filled 2020-06-13: qty 3

## 2020-06-13 MED ORDER — ALBUTEROL SULFATE HFA 108 (90 BASE) MCG/ACT IN AERS: 1.0000 | INHALATION_SPRAY | Freq: Four times a day (QID) | RESPIRATORY_TRACT | 1 refills | Status: AC | PRN

## 2020-06-13 MED ORDER — ALBUTEROL SULFATE HFA 108 (90 BASE) MCG/ACT IN AERS
6.0000 | INHALATION_SPRAY | Freq: Once | RESPIRATORY_TRACT | Status: AC
  Administered 2020-06-13: 13:00:00 6 via RESPIRATORY_TRACT

## 2020-06-13 MED ORDER — PREDNISONE 20 MG PO TABS
60.0000 mg | ORAL_TABLET | Freq: Once | ORAL | Status: AC
  Administered 2020-06-13: 13:00:00 60 mg via ORAL
  Filled 2020-06-13: qty 3

## 2020-06-13 MED ORDER — IPRATROPIUM BROMIDE HFA 17 MCG/ACT IN AERS
2.0000 | INHALATION_SPRAY | Freq: Once | RESPIRATORY_TRACT | Status: AC
  Administered 2020-06-13: 14:00:00 2 via RESPIRATORY_TRACT
  Filled 2020-06-13: qty 12.9

## 2020-06-13 MED ORDER — PREDNISONE 10 MG PO TABS: 20.0000 mg | ORAL_TABLET | Freq: Every day | ORAL | 0 refills | Status: AC

## 2020-06-13 NOTE — Discharge Instructions (Addendum)
Prescription for prednisone sent to your pharmacy. You already had the first dose today so take the next one tomorrow.  Follow up with primary care doctor about your asthma.   Return to the emergency department for any new or worsening symptoms.

## 2020-06-13 NOTE — ED Notes (Signed)
Patient 95-96% on room air while ambulating

## 2020-06-13 NOTE — ED Triage Notes (Signed)
Pt here with reports of cough and shob. States she has asthma and does not have a rescue inhaler. Pt talking in complete sentences. Oxygen saturation 91% in triage.

## 2020-06-13 NOTE — ED Provider Notes (Signed)
[facility] EMERGENCY DEPARTMENT Provider Note   CSN: [id number] Arrival date & time: 06/13/20  0800     History Chief Complaint  Patient presents with  . Asthma    Michelle Werner is a 64 y.o. female with past medical history significant for asthma.  HPI Presents to emergency department today with chief complaint of asthma exacerbation x1 day.  She states yesterday she noticed that she had a cough and felt short of breath.  Later in the evening she had significant wheezing.  She tried to use her inhaler, nebulizer and take a hot steamy shower without any symptom improvement.  She states she does not have a rescue inhaler at home.  She is supposed to follow-up with PCP for follow up of her asthma, however broke her wrist and unfortunately had to cancel the appointment. She works at [employer] and states she had been dusting the day of symptom onset, so thinks that caused her exacerbation. No known covid exposures. Denies fever, chills, congestion, chest pain,   Past Medical History:  Diagnosis Date  . Asthma     There are no problems to display for this patient.   Past Surgical History:  Procedure Laterality Date  . ORIF WRIST FRACTURE Left 03/20/2020   Procedure: OPEN REDUCTION INTERNAL FIXATION (ORIF) WRIST FRACTURE;  Surgeon: Knute Neu, MD;  Location: [facility] OR;  Service: Plastics;  Laterality: Left;  . TONSILLECTOMY       OB History   No obstetric history on file.     No family history on file.  Social History   Tobacco Use  . Smoking status: Never Smoker  . Smokeless tobacco: Never Used  Substance Use Topics  . Alcohol use: Not Currently    Home Medications Prior to Admission medications   Medication Sig Start Date End Date Taking? Authorizing Provider  albuterol (VENTOLIN HFA) 108 (90 Base) MCG/ACT inhaler Inhale 1-2 puffs into the lungs every 6 (six) hours as needed for wheezing or shortness of breath. 06/13/20   Walisiewicz, Yvonna Alanis E, PA-C   levocetirizine (XYZAL) 5 MG tablet Take 5 mg by mouth at bedtime. 02/14/20   [provider]  predniSONE (DELTASONE) 10 MG tablet Take 2 tablets (20 mg total) by mouth daily for 5 days. 06/14/20 06/19/20  Shanon Ace, PA-C    Allergies    Patient has no known allergies.  Review of Systems   Review of Systems All other systems are reviewed and are negative for acute change except as noted in the HPI.  Physical Exam Updated Vital Signs BP (!) 144/86   Pulse 87   Temp 98.4 F (36.9 C) (Oral)   Resp 16   Ht 5\' 4"  (1.626 m)   Wt 68.9 kg   SpO2 95%   BMI 26.09 kg/m   Physical Exam Vitals and nursing note reviewed.  Constitutional:      General: She is not in acute distress.    Appearance: She is not ill-appearing.  HENT:     Head: Normocephalic and atraumatic.     Right Ear: Tympanic membrane and external ear normal.     Left Ear: Tympanic membrane and external ear normal.     Nose: Nose normal.     Mouth/Throat:     Mouth: Mucous membranes are moist.     Pharynx: Oropharynx is clear.  Eyes:     General: No scleral icterus.       Right eye: No discharge.  Left eye: No discharge.     Extraocular Movements: Extraocular movements intact.     Conjunctiva/sclera: Conjunctivae normal.     Pupils: Pupils are equal, round, and reactive to light.  Neck:     Vascular: No JVD.  Cardiovascular:     Rate and Rhythm: Normal rate and regular rhythm.     Pulses: Normal pulses.          Radial pulses are 2+ on the right side and 2+ on the left side.     Heart sounds: Normal heart sounds.  Pulmonary:     Comments: Lungs clear to auscultation in all fields. Symmetric chest rise. No wheezing, rales, or rhonchi.  Oxygen saturation is 95% on room air.  She is speaking in full sentences.  No accessory muscle use, no nasal flaring. Abdominal:     Comments: Abdomen is soft, non-distended, and non-tender in all quadrants. No rigidity, no guarding. No peritoneal signs.   Musculoskeletal:        General: Normal range of motion.     Cervical back: Normal range of motion.  Skin:    General: Skin is warm and dry.     Capillary Refill: Capillary refill takes less than 2 seconds.  Neurological:     Mental Status: She is oriented to person, place, and time.     GCS: GCS eye subscore is 4. GCS verbal subscore is 5. GCS motor subscore is 6.     Comments: Fluent speech, no facial droop.  Psychiatric:        Behavior: Behavior normal.     ED Results / Procedures / Treatments   Labs (all labs ordered are listed, but only abnormal results are displayed) Labs Reviewed  BASIC METABOLIC PANEL - Abnormal; Notable for the following components:      Result Value   CO2 21 (*)    Glucose, Bld 111 (*)    All other components within normal limits  RESP PANEL BY RT-PCR (FLU A&B, COVID) ARPGX2  CBC    EKG EKG Interpretation  Date/Time:  Friday June 13 2020 08:04:54 EST Ventricular Rate:  92 PR Interval:  142 QRS Duration: 74 QT Interval:  372 QTC Calculation: 460 R Axis:   120 Text Interpretation: Normal sinus rhythm Right axis deviation Abnormal ECG No old tracing to compare Confirmed by Jacalyn Lefevre [phone]) on 06/13/2020 1:08:16 PM   Radiology DG Chest 2 View  Result Date: 06/13/2020 CLINICAL DATA:  Cough and shortness of breath EXAM: CHEST - 2 VIEW COMPARISON:  None. FINDINGS: Cardiac shadow is within normal limits. Moderate to large hiatal hernia is seen. The lungs are clear bilaterally. No bony abnormality is seen. IMPRESSION: Hiatal hernia. No other focal abnormality is noted. Electronically Signed   By: Alcide Clever M.D.   On: 06/13/2020 08:37    Procedures Procedures (including critical care time)  Medications Ordered in ED Medications  albuterol (VENTOLIN HFA) 108 (90 Base) MCG/ACT inhaler 2 puff (2 puffs Inhalation Given 06/13/20 0809)  predniSONE (DELTASONE) tablet 60 mg (60 mg Oral Given 06/13/20 1248)  albuterol (VENTOLIN HFA) 108  (90 Base) MCG/ACT inhaler 6 puff (6 puffs Inhalation Given 06/13/20 1254)  ipratropium (ATROVENT HFA) inhaler 2 puff (2 puffs Inhalation Given 06/13/20 1331)  ipratropium-albuterol (DUONEB) 0.5-2.5 (3) MG/3ML nebulizer solution 3 mL (3 mLs Nebulization Given 06/13/20 1402)    ED Course  I have reviewed the triage vital signs and the nursing notes.  Pertinent labs & imaging results that were available during my  care of the patient were reviewed by me and considered in my medical decision making (see chart for details).    MDM Rules/Calculators/A&P                          History provided by patient with additional history obtained from chart review.    Patient ambulated in ED with O2 saturations maintained >90, no current signs of respiratory distress/ She was noted to have borderline hypoxia in triage with O2 saturation 91%, was given albuterol. Work up initiated in triage. EKG without ischemic changes. CBC and BMP overall unremarkable. I viewed pt's chest xray and it does not suggest acute infectious processes, does have large hiatal hernia. I agree with the radiologist interpretation.  Patient had wait time of approximately 5 hours in the lobby. One my exam she is not hypoxic. No wheezing heard. She has slight increased work of breathing still. Given albuterol inhaler again.  Reassess patient afterwards and she now has a inspiratory wheezing. Prednisone given.  Covid and influenza tests are negative.  Duoneb given.  On reassessment patient feels like her breathing is back to baseline.  She is much improved.  Lungs are clear to auscultation in all fields.  Oxygen saturation is 96% on room air.  Patient will be discharged with 5 day prednisone burst.   Pt has been instructed to continue using prescribed medications and to speak with PCP about today's exacerbation.   Michelle Werner was evaluated in Emergency Department on 06/13/2020 for the symptoms described in the history of present  illness. She was evaluated in the context of the global COVID-19 pandemic, which necessitated consideration that the patient might be at risk for infection with the SARS-CoV-2 virus that causes COVID-19. Institutional protocols and algorithms that pertain to the evaluation of patients at risk for COVID-19 are in a state of rapid change based on information released by regulatory bodies including the CDC and federal and state organizations. These policies and algorithms were followed during the patient's care in the ED.   Portions of this note were generated with Scientist, clinical (histocompatibility and immunogenetics). Dictation errors may occur despite best attempts at proofreading.   Final Clinical Impression(s) / ED Diagnoses Final diagnoses:  Mild intermittent asthma with exacerbation    Rx / DC Orders ED Discharge Orders         Ordered    predniSONE (DELTASONE) 10 MG tablet  Daily        06/13/20 1315    albuterol (VENTOLIN HFA) 108 (90 Base) MCG/ACT inhaler  Every 6 hours PRN        06/13/20 1315           Shanon Ace, PA-C 06/13/20 1436    Jacalyn Lefevre, MD 06/13/20 1444

## 2021-02-18 ENCOUNTER — Other Ambulatory Visit: Payer: Self-pay | Admitting: Family Medicine

## 2021-02-18 DIAGNOSIS — M7989 Other specified soft tissue disorders: Secondary | ICD-10-CM

## 2021-02-23 ENCOUNTER — Ambulatory Visit
Admission: RE | Admit: 2021-02-23 | Discharge: 2021-02-23 | Disposition: A | Discharge status: Home / Self Care | Payer: BC Managed Care – PPO | Source: Ambulatory Visit | Attending: Family Medicine | Admitting: Family Medicine

## 2021-02-23 DIAGNOSIS — M7989 Other specified soft tissue disorders: Secondary | ICD-10-CM

## 2021-03-11 DIAGNOSIS — I8312 Varicose veins of left lower extremity with inflammation: Secondary | ICD-10-CM | POA: Diagnosis not present

## 2021-03-11 DIAGNOSIS — I8311 Varicose veins of right lower extremity with inflammation: Secondary | ICD-10-CM | POA: Diagnosis not present

## 2021-03-16 DIAGNOSIS — I83891 Varicose veins of right lower extremities with other complications: Secondary | ICD-10-CM | POA: Diagnosis not present

## 2021-03-16 DIAGNOSIS — I8311 Varicose veins of right lower extremity with inflammation: Secondary | ICD-10-CM | POA: Diagnosis not present

## 2021-03-16 DIAGNOSIS — I83811 Varicose veins of right lower extremities with pain: Secondary | ICD-10-CM | POA: Diagnosis not present

## 2021-03-17 DIAGNOSIS — I8311 Varicose veins of right lower extremity with inflammation: Secondary | ICD-10-CM | POA: Diagnosis not present

## 2021-03-17 DIAGNOSIS — I83811 Varicose veins of right lower extremities with pain: Secondary | ICD-10-CM | POA: Diagnosis not present

## 2021-03-17 DIAGNOSIS — I83891 Varicose veins of right lower extremities with other complications: Secondary | ICD-10-CM | POA: Diagnosis not present

## 2021-03-18 DIAGNOSIS — I8311 Varicose veins of right lower extremity with inflammation: Secondary | ICD-10-CM | POA: Diagnosis not present

## 2021-03-18 DIAGNOSIS — I83811 Varicose veins of right lower extremities with pain: Secondary | ICD-10-CM | POA: Diagnosis not present

## 2021-03-20 DIAGNOSIS — I8311 Varicose veins of right lower extremity with inflammation: Secondary | ICD-10-CM | POA: Diagnosis not present

## 2021-04-01 DIAGNOSIS — J45901 Unspecified asthma with (acute) exacerbation: Secondary | ICD-10-CM | POA: Diagnosis not present

## 2021-04-01 DIAGNOSIS — J4540 Moderate persistent asthma, uncomplicated: Secondary | ICD-10-CM | POA: Diagnosis not present

## 2021-04-01 DIAGNOSIS — J45990 Exercise induced bronchospasm: Secondary | ICD-10-CM | POA: Diagnosis not present

## 2021-04-01 DIAGNOSIS — J309 Allergic rhinitis, unspecified: Secondary | ICD-10-CM | POA: Diagnosis not present

## 2021-04-08 DIAGNOSIS — I8311 Varicose veins of right lower extremity with inflammation: Secondary | ICD-10-CM | POA: Diagnosis not present

## 2021-04-13 DIAGNOSIS — Z0000 Encounter for general adult medical examination without abnormal findings: Secondary | ICD-10-CM | POA: Diagnosis not present

## 2021-04-15 DIAGNOSIS — E559 Vitamin D deficiency, unspecified: Secondary | ICD-10-CM | POA: Diagnosis not present

## 2021-04-15 DIAGNOSIS — Z23 Encounter for immunization: Secondary | ICD-10-CM | POA: Diagnosis not present

## 2021-04-15 DIAGNOSIS — Z1211 Encounter for screening for malignant neoplasm of colon: Secondary | ICD-10-CM | POA: Diagnosis not present

## 2021-04-15 DIAGNOSIS — Z0000 Encounter for general adult medical examination without abnormal findings: Secondary | ICD-10-CM | POA: Diagnosis not present

## 2021-04-16 ENCOUNTER — Other Ambulatory Visit: Payer: Self-pay | Admitting: Family Medicine

## 2021-04-16 DIAGNOSIS — Z1231 Encounter for screening mammogram for malignant neoplasm of breast: Secondary | ICD-10-CM

## 2021-04-22 DIAGNOSIS — I8311 Varicose veins of right lower extremity with inflammation: Secondary | ICD-10-CM | POA: Diagnosis not present

## 2021-04-30 DIAGNOSIS — J329 Chronic sinusitis, unspecified: Secondary | ICD-10-CM | POA: Diagnosis not present

## 2021-04-30 DIAGNOSIS — J069 Acute upper respiratory infection, unspecified: Secondary | ICD-10-CM | POA: Diagnosis not present

## 2021-04-30 DIAGNOSIS — B9689 Other specified bacterial agents as the cause of diseases classified elsewhere: Secondary | ICD-10-CM | POA: Diagnosis not present

## 2021-04-30 DIAGNOSIS — J45901 Unspecified asthma with (acute) exacerbation: Secondary | ICD-10-CM | POA: Diagnosis not present

## 2021-05-06 DIAGNOSIS — I8311 Varicose veins of right lower extremity with inflammation: Secondary | ICD-10-CM | POA: Diagnosis not present

## 2021-05-11 DIAGNOSIS — E559 Vitamin D deficiency, unspecified: Secondary | ICD-10-CM | POA: Diagnosis not present

## 2021-05-14 DIAGNOSIS — J309 Allergic rhinitis, unspecified: Secondary | ICD-10-CM | POA: Diagnosis not present

## 2021-05-14 DIAGNOSIS — Z7185 Encounter for immunization safety counseling: Secondary | ICD-10-CM | POA: Diagnosis not present

## 2021-05-14 DIAGNOSIS — Z23 Encounter for immunization: Secondary | ICD-10-CM | POA: Diagnosis not present

## 2021-05-14 DIAGNOSIS — E559 Vitamin D deficiency, unspecified: Secondary | ICD-10-CM | POA: Diagnosis not present

## 2021-05-14 DIAGNOSIS — J4540 Moderate persistent asthma, uncomplicated: Secondary | ICD-10-CM | POA: Diagnosis not present

## 2021-05-20 DIAGNOSIS — F4320 Adjustment disorder, unspecified: Secondary | ICD-10-CM | POA: Diagnosis not present

## 2021-06-03 DIAGNOSIS — F4320 Adjustment disorder, unspecified: Secondary | ICD-10-CM | POA: Diagnosis not present

## 2021-06-04 DIAGNOSIS — J45990 Exercise induced bronchospasm: Secondary | ICD-10-CM | POA: Diagnosis not present

## 2021-06-04 DIAGNOSIS — J309 Allergic rhinitis, unspecified: Secondary | ICD-10-CM | POA: Diagnosis not present

## 2021-06-04 DIAGNOSIS — Z23 Encounter for immunization: Secondary | ICD-10-CM | POA: Diagnosis not present

## 2021-06-04 DIAGNOSIS — J45901 Unspecified asthma with (acute) exacerbation: Secondary | ICD-10-CM | POA: Diagnosis not present

## 2021-06-04 DIAGNOSIS — J4540 Moderate persistent asthma, uncomplicated: Secondary | ICD-10-CM | POA: Diagnosis not present

## 2021-06-10 DIAGNOSIS — U071 COVID-19: Secondary | ICD-10-CM | POA: Diagnosis not present

## 2021-06-10 DIAGNOSIS — J069 Acute upper respiratory infection, unspecified: Secondary | ICD-10-CM | POA: Diagnosis not present

## 2021-06-11 ENCOUNTER — Other Ambulatory Visit: Payer: Self-pay | Admitting: Family Medicine

## 2021-06-11 DIAGNOSIS — E2839 Other primary ovarian failure: Secondary | ICD-10-CM

## 2021-07-01 DIAGNOSIS — F339 Major depressive disorder, recurrent, unspecified: Secondary | ICD-10-CM | POA: Diagnosis not present

## 2021-07-01 DIAGNOSIS — F411 Generalized anxiety disorder: Secondary | ICD-10-CM | POA: Diagnosis not present

## 2021-07-02 DIAGNOSIS — M85851 Other specified disorders of bone density and structure, right thigh: Secondary | ICD-10-CM | POA: Diagnosis not present

## 2021-07-02 DIAGNOSIS — Z1231 Encounter for screening mammogram for malignant neoplasm of breast: Secondary | ICD-10-CM | POA: Diagnosis not present

## 2021-07-02 DIAGNOSIS — M8589 Other specified disorders of bone density and structure, multiple sites: Secondary | ICD-10-CM | POA: Diagnosis not present

## 2021-07-08 DIAGNOSIS — F411 Generalized anxiety disorder: Secondary | ICD-10-CM | POA: Diagnosis not present

## 2021-07-08 DIAGNOSIS — F339 Major depressive disorder, recurrent, unspecified: Secondary | ICD-10-CM | POA: Diagnosis not present

## 2021-07-15 DIAGNOSIS — F411 Generalized anxiety disorder: Secondary | ICD-10-CM | POA: Diagnosis not present

## 2021-07-15 DIAGNOSIS — F4320 Adjustment disorder, unspecified: Secondary | ICD-10-CM | POA: Diagnosis not present

## 2021-07-15 DIAGNOSIS — F339 Major depressive disorder, recurrent, unspecified: Secondary | ICD-10-CM | POA: Diagnosis not present

## 2021-07-22 DIAGNOSIS — F4320 Adjustment disorder, unspecified: Secondary | ICD-10-CM | POA: Diagnosis not present

## 2021-07-22 DIAGNOSIS — F411 Generalized anxiety disorder: Secondary | ICD-10-CM | POA: Diagnosis not present

## 2021-07-22 DIAGNOSIS — F339 Major depressive disorder, recurrent, unspecified: Secondary | ICD-10-CM | POA: Diagnosis not present

## 2021-07-29 DIAGNOSIS — F339 Major depressive disorder, recurrent, unspecified: Secondary | ICD-10-CM | POA: Diagnosis not present

## 2021-07-29 DIAGNOSIS — F411 Generalized anxiety disorder: Secondary | ICD-10-CM | POA: Diagnosis not present

## 2021-08-12 DIAGNOSIS — F411 Generalized anxiety disorder: Secondary | ICD-10-CM | POA: Diagnosis not present

## 2021-08-12 DIAGNOSIS — F4320 Adjustment disorder, unspecified: Secondary | ICD-10-CM | POA: Diagnosis not present

## 2021-09-09 DIAGNOSIS — E559 Vitamin D deficiency, unspecified: Secondary | ICD-10-CM | POA: Diagnosis not present

## 2021-09-18 DIAGNOSIS — M8580 Other specified disorders of bone density and structure, unspecified site: Secondary | ICD-10-CM | POA: Diagnosis not present

## 2021-09-18 DIAGNOSIS — J309 Allergic rhinitis, unspecified: Secondary | ICD-10-CM | POA: Diagnosis not present

## 2021-09-18 DIAGNOSIS — J4540 Moderate persistent asthma, uncomplicated: Secondary | ICD-10-CM | POA: Diagnosis not present

## 2021-09-18 DIAGNOSIS — E559 Vitamin D deficiency, unspecified: Secondary | ICD-10-CM | POA: Diagnosis not present

## 2021-10-14 DIAGNOSIS — I8311 Varicose veins of right lower extremity with inflammation: Secondary | ICD-10-CM | POA: Diagnosis not present

## 2021-10-15 DIAGNOSIS — Z23 Encounter for immunization: Secondary | ICD-10-CM | POA: Diagnosis not present

## 2021-10-26 DIAGNOSIS — I83891 Varicose veins of right lower extremities with other complications: Secondary | ICD-10-CM | POA: Diagnosis not present

## 2022-01-13 DIAGNOSIS — Z23 Encounter for immunization: Secondary | ICD-10-CM | POA: Diagnosis not present

## 2022-02-19 DIAGNOSIS — N6310 Unspecified lump in the right breast, unspecified quadrant: Secondary | ICD-10-CM | POA: Diagnosis not present

## 2022-02-22 DIAGNOSIS — N6310 Unspecified lump in the right breast, unspecified quadrant: Secondary | ICD-10-CM | POA: Diagnosis not present

## 2022-03-03 DIAGNOSIS — N6001 Solitary cyst of right breast: Secondary | ICD-10-CM | POA: Diagnosis not present

## 2022-03-03 DIAGNOSIS — N6311 Unspecified lump in the right breast, upper outer quadrant: Secondary | ICD-10-CM | POA: Diagnosis not present

## 2022-03-09 DIAGNOSIS — E559 Vitamin D deficiency, unspecified: Secondary | ICD-10-CM | POA: Diagnosis not present

## 2022-03-15 DIAGNOSIS — M8580 Other specified disorders of bone density and structure, unspecified site: Secondary | ICD-10-CM | POA: Diagnosis not present

## 2022-03-15 DIAGNOSIS — N6310 Unspecified lump in the right breast, unspecified quadrant: Secondary | ICD-10-CM | POA: Diagnosis not present

## 2022-03-15 DIAGNOSIS — E559 Vitamin D deficiency, unspecified: Secondary | ICD-10-CM | POA: Diagnosis not present

## 2022-04-12 DIAGNOSIS — Z0000 Encounter for general adult medical examination without abnormal findings: Secondary | ICD-10-CM | POA: Diagnosis not present

## 2022-04-12 DIAGNOSIS — Z114 Encounter for screening for human immunodeficiency virus [HIV]: Secondary | ICD-10-CM | POA: Diagnosis not present

## 2022-04-15 DIAGNOSIS — Z23 Encounter for immunization: Secondary | ICD-10-CM | POA: Diagnosis not present

## 2022-04-15 DIAGNOSIS — Z1211 Encounter for screening for malignant neoplasm of colon: Secondary | ICD-10-CM | POA: Diagnosis not present

## 2022-04-15 DIAGNOSIS — E785 Hyperlipidemia, unspecified: Secondary | ICD-10-CM | POA: Diagnosis not present

## 2022-04-15 DIAGNOSIS — E559 Vitamin D deficiency, unspecified: Secondary | ICD-10-CM | POA: Diagnosis not present

## 2022-04-15 DIAGNOSIS — Z0000 Encounter for general adult medical examination without abnormal findings: Secondary | ICD-10-CM | POA: Diagnosis not present

## 2022-04-15 DIAGNOSIS — I10 Essential (primary) hypertension: Secondary | ICD-10-CM | POA: Diagnosis not present

## 2022-05-03 DIAGNOSIS — I83891 Varicose veins of right lower extremities with other complications: Secondary | ICD-10-CM | POA: Diagnosis not present

## 2022-05-06 IMAGING — US US EXTREM LOW VENOUS*R*
1 series · 13 of 24 positions shown · non-contrast
Comparison: None.

CLINICAL DATA: Right leg pain and swelling, initial encounter



[Series 1: us extrem low venous*right* · 0.08mm/px · 13 of 34 slices shown]
[im 1/34]
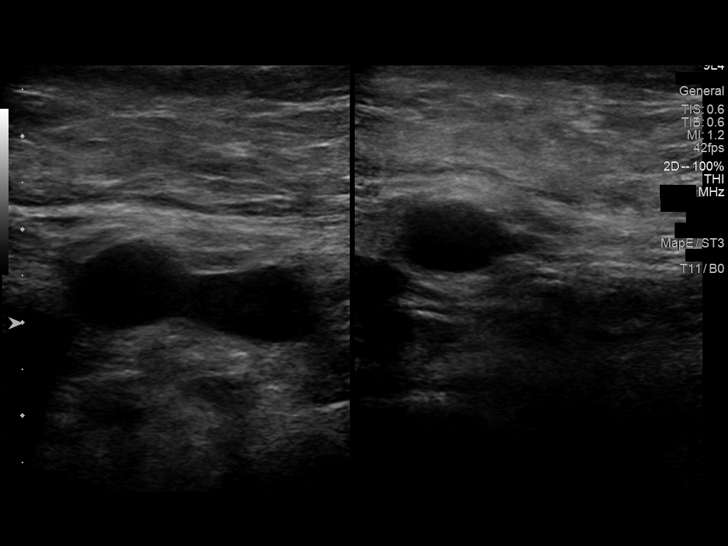
[im 3/34]
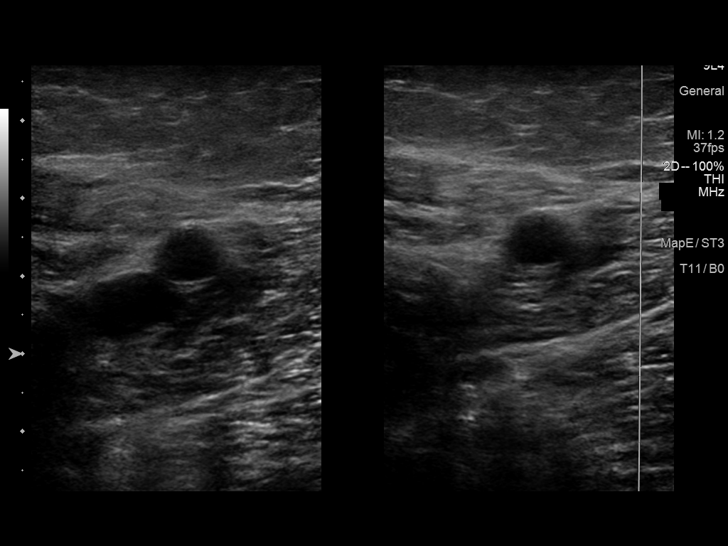
[im 6/34]
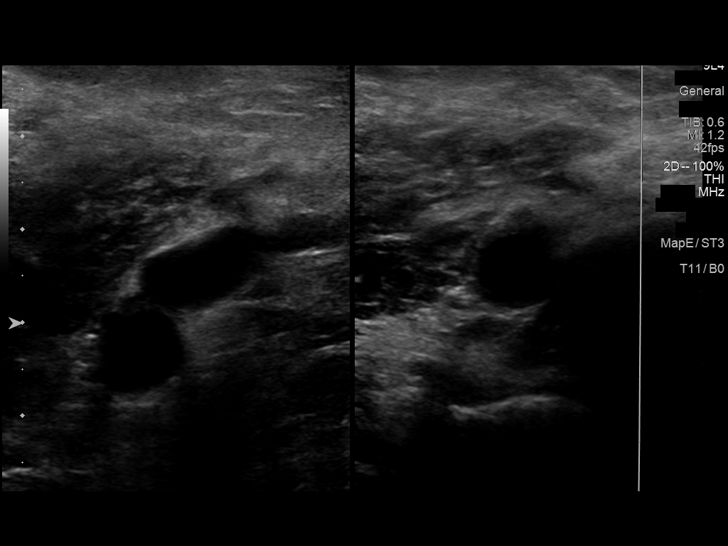
[im 9/34]
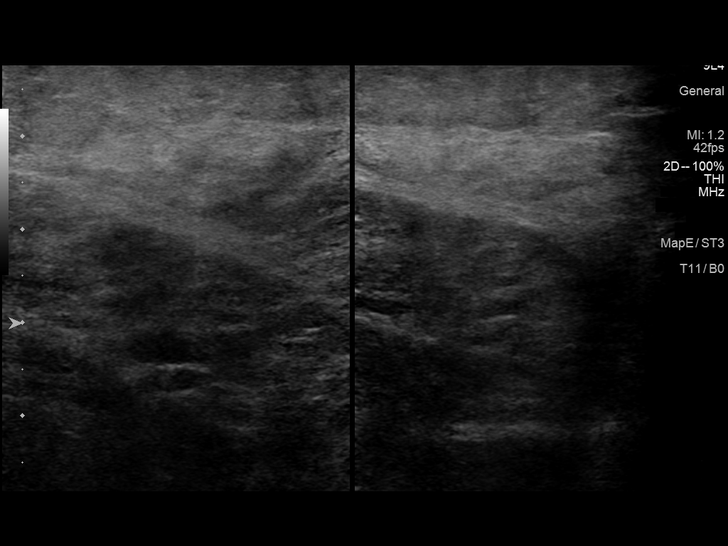
[im 12/34]
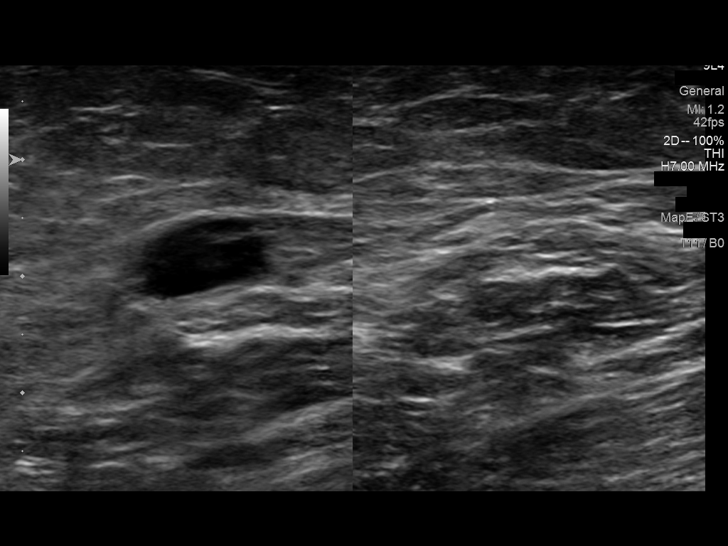
[im 15/34]
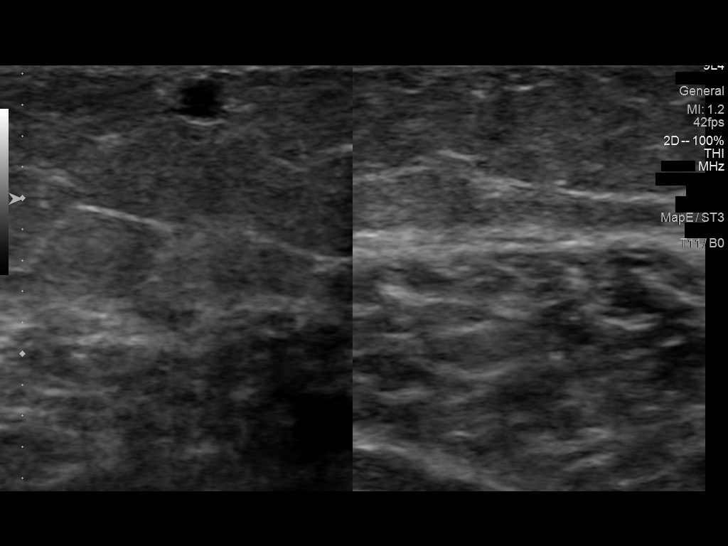
[im 18/34]
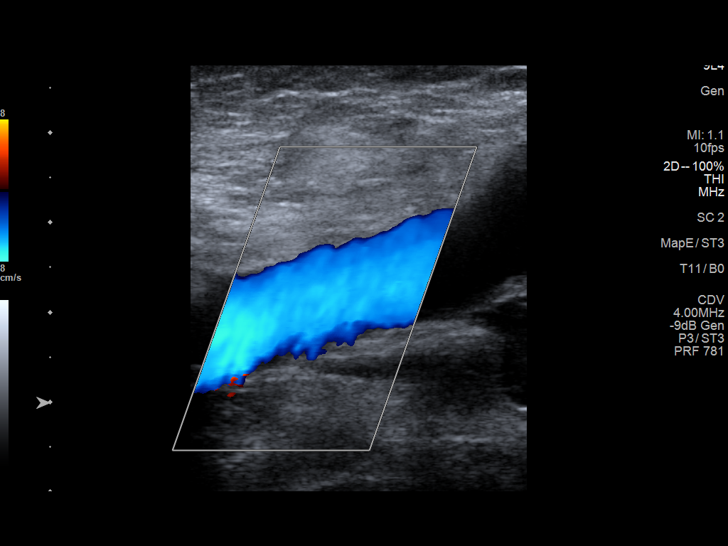
[im 19/34]
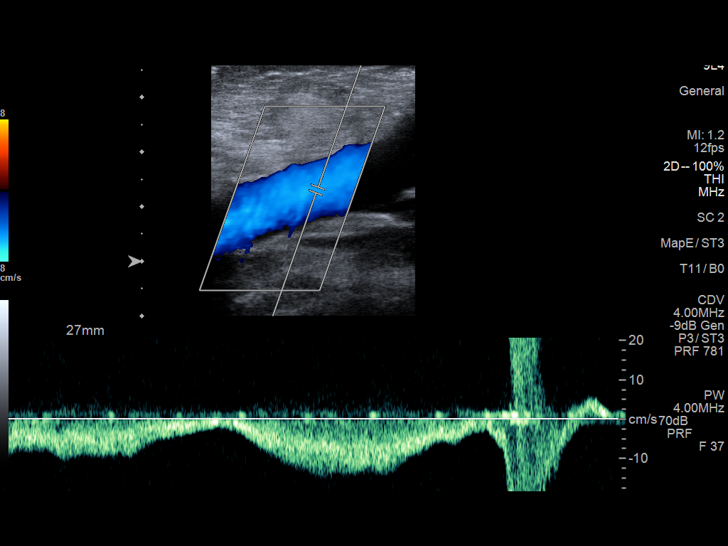
[im 22/34]
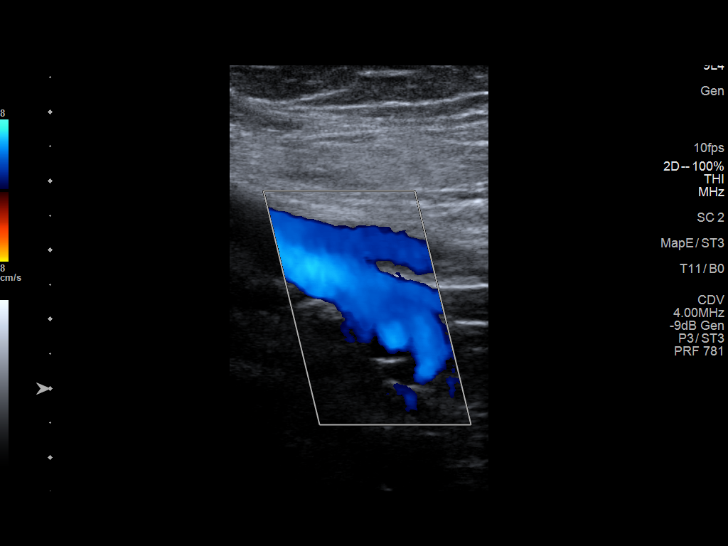
[im 25/34]
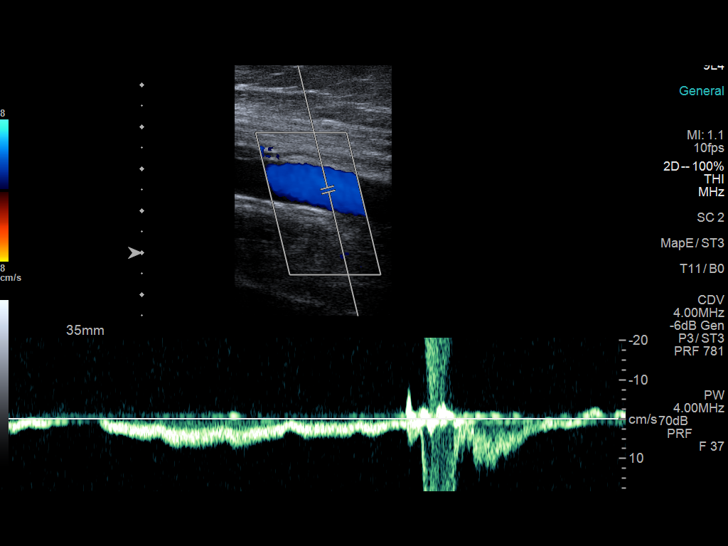
[im 28/34]
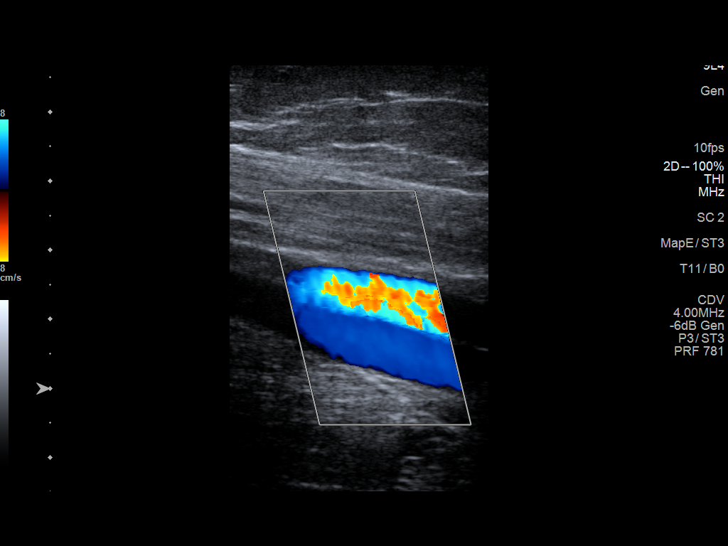
[im 31/34]
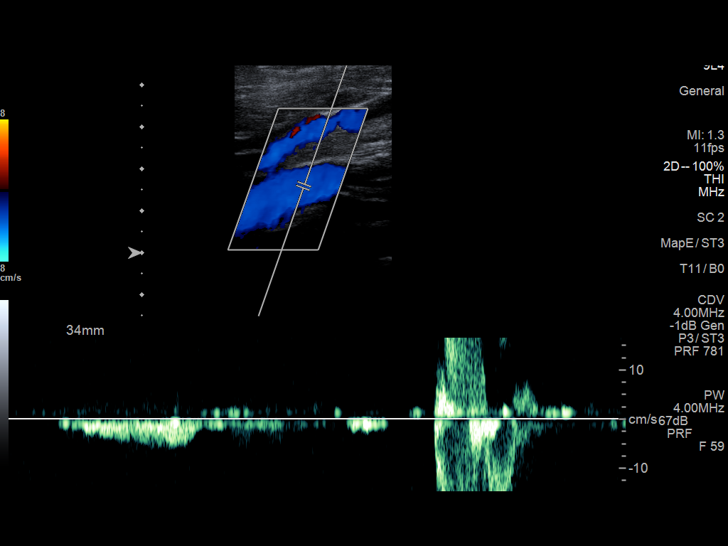
[im 34/34]
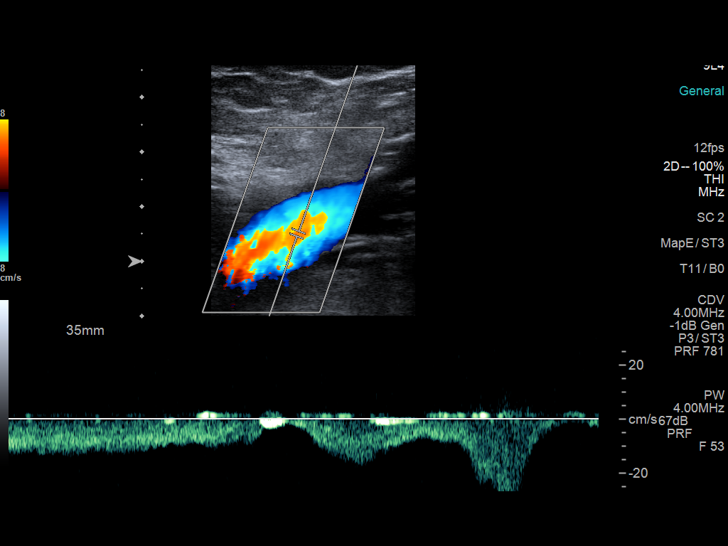

[13 of 24 positions shown; findings below may reference images not displayed]

FINDINGS: Contralateral Common Femoral Vein: Respiratory phasicity is normal
and symmetric with the symptomatic side. No evidence of thrombus.
Normal compressibility.

Common Femoral Vein: No evidence of thrombus. Normal
compressibility, respiratory phasicity and response to augmentation.

Saphenofemoral Junction: No evidence of thrombus. Normal
compressibility and flow on color Doppler imaging.

Profunda Femoral Vein: No evidence of thrombus. Normal
compressibility and flow on color Doppler imaging.

Femoral Vein: No evidence of thrombus. Normal compressibility,
respiratory phasicity and response to augmentation.

Popliteal Vein: No evidence of thrombus. Normal compressibility,
respiratory phasicity and response to augmentation.

Calf Veins: No evidence of thrombus. Normal compressibility and flow
on color Doppler imaging.

Superficial Great Saphenous Vein: No evidence of thrombus. Normal
compressibility.

Venous Reflux:  None.

Other Findings:  None.
IMPRESSION: No evidence of deep venous thrombosis.

## 2022-05-24 ENCOUNTER — Other Ambulatory Visit: Payer: Self-pay | Admitting: Family Medicine

## 2022-05-24 DIAGNOSIS — J4540 Moderate persistent asthma, uncomplicated: Secondary | ICD-10-CM | POA: Diagnosis not present

## 2022-05-24 DIAGNOSIS — I10 Essential (primary) hypertension: Secondary | ICD-10-CM | POA: Diagnosis not present

## 2022-05-24 DIAGNOSIS — E785 Hyperlipidemia, unspecified: Secondary | ICD-10-CM | POA: Diagnosis not present

## 2022-05-24 DIAGNOSIS — R109 Unspecified abdominal pain: Secondary | ICD-10-CM

## 2022-05-24 DIAGNOSIS — K219 Gastro-esophageal reflux disease without esophagitis: Secondary | ICD-10-CM | POA: Diagnosis not present

## 2022-05-24 DIAGNOSIS — J309 Allergic rhinitis, unspecified: Secondary | ICD-10-CM | POA: Diagnosis not present

## 2022-05-25 DIAGNOSIS — Z86010 Personal history of colonic polyps: Secondary | ICD-10-CM | POA: Diagnosis not present

## 2022-05-25 DIAGNOSIS — K219 Gastro-esophageal reflux disease without esophagitis: Secondary | ICD-10-CM | POA: Diagnosis not present

## 2022-05-25 DIAGNOSIS — I10 Essential (primary) hypertension: Secondary | ICD-10-CM | POA: Diagnosis not present

## 2022-05-25 DIAGNOSIS — Z1211 Encounter for screening for malignant neoplasm of colon: Secondary | ICD-10-CM | POA: Diagnosis not present

## 2022-05-28 ENCOUNTER — Ambulatory Visit
Admission: RE | Admit: 2022-05-28 | Discharge: 2022-05-28 | Disposition: A | Discharge status: Home / Self Care | Payer: BC Managed Care – PPO | Source: Ambulatory Visit | Attending: Family Medicine | Admitting: Family Medicine

## 2022-05-28 DIAGNOSIS — R109 Unspecified abdominal pain: Secondary | ICD-10-CM

## 2022-06-01 ENCOUNTER — Other Ambulatory Visit: Payer: Self-pay | Admitting: Family Medicine

## 2022-06-01 DIAGNOSIS — N281 Cyst of kidney, acquired: Secondary | ICD-10-CM

## 2022-06-08 DIAGNOSIS — E559 Vitamin D deficiency, unspecified: Secondary | ICD-10-CM | POA: Diagnosis not present

## 2022-06-09 DIAGNOSIS — Z23 Encounter for immunization: Secondary | ICD-10-CM | POA: Diagnosis not present

## 2022-06-16 DIAGNOSIS — E559 Vitamin D deficiency, unspecified: Secondary | ICD-10-CM | POA: Diagnosis not present

## 2022-07-05 DIAGNOSIS — I10 Essential (primary) hypertension: Secondary | ICD-10-CM | POA: Diagnosis not present

## 2022-07-05 DIAGNOSIS — K219 Gastro-esophageal reflux disease without esophagitis: Secondary | ICD-10-CM | POA: Diagnosis not present

## 2022-07-05 DIAGNOSIS — J309 Allergic rhinitis, unspecified: Secondary | ICD-10-CM | POA: Diagnosis not present

## 2022-07-05 DIAGNOSIS — E785 Hyperlipidemia, unspecified: Secondary | ICD-10-CM | POA: Diagnosis not present

## 2022-07-13 ENCOUNTER — Inpatient Hospital Stay: Admission: RE | Admit: 2022-07-13 | Payer: BC Managed Care – PPO | Source: Ambulatory Visit

## 2022-10-27 DIAGNOSIS — D123 Benign neoplasm of transverse colon: Secondary | ICD-10-CM | POA: Diagnosis not present

## 2022-10-27 DIAGNOSIS — K635 Polyp of colon: Secondary | ICD-10-CM | POA: Diagnosis not present

## 2022-10-27 DIAGNOSIS — D125 Benign neoplasm of sigmoid colon: Secondary | ICD-10-CM | POA: Diagnosis not present

## 2022-10-27 DIAGNOSIS — Z1211 Encounter for screening for malignant neoplasm of colon: Secondary | ICD-10-CM | POA: Diagnosis not present

## 2022-12-08 DIAGNOSIS — E559 Vitamin D deficiency, unspecified: Secondary | ICD-10-CM | POA: Diagnosis not present

## 2022-12-16 DIAGNOSIS — E559 Vitamin D deficiency, unspecified: Secondary | ICD-10-CM | POA: Diagnosis not present

## 2022-12-16 DIAGNOSIS — J4540 Moderate persistent asthma, uncomplicated: Secondary | ICD-10-CM | POA: Diagnosis not present

## 2022-12-16 DIAGNOSIS — K635 Polyp of colon: Secondary | ICD-10-CM | POA: Diagnosis not present

## 2022-12-16 DIAGNOSIS — Z87898 Personal history of other specified conditions: Secondary | ICD-10-CM | POA: Diagnosis not present

## 2022-12-28 DIAGNOSIS — R600 Localized edema: Secondary | ICD-10-CM | POA: Diagnosis not present

## 2022-12-28 DIAGNOSIS — I872 Venous insufficiency (chronic) (peripheral): Secondary | ICD-10-CM | POA: Diagnosis not present

## 2023-01-31 ENCOUNTER — Ambulatory Visit
Admission: EM | Admit: 2023-01-31 | Discharge: 2023-01-31 | Disposition: A | Discharge status: Home / Self Care | Payer: BC Managed Care – PPO

## 2023-01-31 ENCOUNTER — Encounter: Payer: Self-pay | Admitting: Emergency Medicine

## 2023-01-31 DIAGNOSIS — L237 Allergic contact dermatitis due to plants, except food: Secondary | ICD-10-CM

## 2023-01-31 DIAGNOSIS — R21 Rash and other nonspecific skin eruption: Secondary | ICD-10-CM

## 2023-01-31 MED ORDER — PREDNISONE 10 MG (21) PO TBPK: ORAL_TABLET | Freq: Every day | ORAL | 0 refills | Status: DC

## 2023-01-31 MED ORDER — METHYLPREDNISOLONE SODIUM SUCC 125 MG IJ SOLR
125.0000 mg | Freq: Once | INTRAMUSCULAR | Status: AC
  Administered 2023-01-31: 125 mg via INTRAMUSCULAR

## 2023-01-31 NOTE — Discharge Instructions (Addendum)
Advised patient to take medication as directed with food to completion.  Encouraged to increase daily water intake to 64 ounces per day while taking this medication.  Advised patient to change bed linens for the next 2 to 3 days to avoid recontamination.  Advise if symptoms worsen and/or unresolved please follow-up with PCP or here for further evaluation.

## 2023-01-31 NOTE — ED Triage Notes (Signed)
Patient c/o poison ivy on both arms and neck x 1 week.  The areas are red and very itchy.  Patient has used oatmeal soap and Benadryl spray.

## 2023-01-31 NOTE — ED Provider Notes (Signed)
[facility]    CSN: [id number] Arrival date & time: 01/31/23  0830      History   Chief Complaint Chief Complaint  Patient presents with   Rash    HPI Michelle Werner is a 67 y.o. female.   HPI Very pleasant 67 year old female presents with poison ivy rash for 7 days.  PMH significant for asthma.  Past Medical History:  Diagnosis Date   Asthma     There are no problems to display for this patient.   Past Surgical History:  Procedure Laterality Date   ORIF WRIST FRACTURE Left 03/20/2020   Procedure: OPEN REDUCTION INTERNAL FIXATION (ORIF) WRIST FRACTURE;  Surgeon: Knute Neu, MD;  Location: [facility] OR;  Service: Plastics;  Laterality: Left;   TONSILLECTOMY      OB History   No obstetric history on file.      Home Medications    Prior to Admission medications   Medication Sig Start Date End Date Taking? Authorizing Provider  albuterol (VENTOLIN HFA) 108 (90 Base) MCG/ACT inhaler Inhale 1-2 puffs into the lungs every 6 (six) hours as needed for wheezing or shortness of breath. 06/13/20  Yes Walisiewicz, Kaitlyn E, PA-C  levocetirizine (XYZAL) 5 MG tablet Take 5 mg by mouth at bedtime. 02/14/20  Yes [provider]  montelukast (SINGULAIR) 10 MG tablet Take 10 mg by mouth at bedtime.   Yes [provider]  predniSONE (STERAPRED UNI-PAK 21 TAB) 10 MG (21) TBPK tablet Take by mouth daily. Take 6 tabs by mouth daily  for 2 days, then 5 tabs for 2 days, then 4 tabs for 2 days, then 3 tabs for 2 days, 2 tabs for 2 days, then 1 tab by mouth daily for 2 days 01/31/23  Yes Trevor Iha, FNP    Family History History reviewed. No pertinent family history.  Social History Social History   Tobacco Use   Smoking status: Never   Smokeless tobacco: Never  Vaping Use   Vaping status: Never Used  Substance Use Topics   Alcohol use: Not Currently   Drug use: Never     Allergies   Patient has no known allergies.   Review of Systems Review  of Systems  Skin:  Positive for rash.  All other systems reviewed and are negative.    Physical Exam Triage Vital Signs ED Triage Vitals  Encounter Vitals Group     BP      Systolic BP Percentile      Diastolic BP Percentile      Pulse      Resp      Temp      Temp src      SpO2      Weight      Height      Head Circumference      Peak Flow      Pain Score      Pain Loc      Pain Education      Exclude from Growth Chart    No data found.  Updated Vital Signs BP 121/70 (BP Location: Right Arm)   Pulse 75   Temp 98.3 F (36.8 C) (Oral)   Resp 18   Ht 5\' 4"  (1.626 m)   Wt 148 lb (67.1 kg)   SpO2 97%   BMI 25.40 kg/m      Physical Exam Vitals and nursing note reviewed.  Constitutional:      General: She is not in acute distress.  Appearance: Normal appearance. She is normal weight. She is not ill-appearing.  HENT:     Head: Normocephalic and atraumatic.     Mouth/Throat:     Mouth: Mucous membranes are moist.     Pharynx: Oropharynx is clear.  Eyes:     Extraocular Movements: Extraocular movements intact.     Conjunctiva/sclera: Conjunctivae normal.     Pupils: Pupils are equal, round, and reactive to light.  Cardiovascular:     Rate and Rhythm: Normal rate and regular rhythm.     Pulses: Normal pulses.     Heart sounds: Normal heart sounds.  Pulmonary:     Effort: Pulmonary effort is normal.     Breath sounds: Normal breath sounds. No wheezing, rhonchi or rales.  Musculoskeletal:        General: Normal range of motion.     Cervical back: Normal range of motion and neck supple.  Skin:    General: Skin is warm and dry.     Comments: Neck (right sided anterior inferior aspect)/bilateral lower arms (volar aspects): Pruritic, maculopapular eruption with grouped linear vesicular lesions noted-please see images below  Neurological:     General: No focal deficit present.     Mental Status: She is alert and oriented to person, place, and time. Mental  status is at baseline.  Psychiatric:        Mood and Affect: Mood normal.        Behavior: Behavior normal.         UC Treatments / Results  Labs (all labs ordered are listed, but only abnormal results are displayed) Labs Reviewed - No data to display  EKG   Radiology No results found.  Procedures Procedures (including critical care time)  Medications Ordered in UC Medications  methylPREDNISolone sodium succinate (SOLU-MEDROL) 125 mg/2 mL injection 125 mg (125 mg Intramuscular Given 01/31/23 0904)    Initial Impression / Assessment and Plan / UC Course  I have reviewed the triage vital signs and the nursing notes.  Pertinent labs & imaging results that were available during my care of the patient were reviewed by me and considered in my medical decision making (see chart for details).     MDM: 1.  Poison ivy dermatitis-IM Solu-Medrol 125 mg given once in clinic and prior to discharge; 2.  Rash and nonspecific skin eruption-Rx'd Sterapred Unipak (tapering from 60 mg to 10 mg over 10 days). Advised patient to take medication as directed with food to completion.  Encouraged to increase daily water intake to 64 ounces per day while taking this medication.  Advised patient to change bed linens for the next 2 to 3 days to avoid recontamination.  Advise if symptoms worsen and/or unresolved please follow-up with PCP or here for further evaluation.  Patient discharged home, hemodynamically stable.  Final Clinical Impressions(s) / UC Diagnoses   Final diagnoses:  Poison ivy dermatitis  Rash and nonspecific skin eruption     Discharge Instructions      Advised patient to take medication as directed with food to completion.  Encouraged to increase daily water intake to 64 ounces per day while taking this medication.  Advised patient to change bed linens for the next 2 to 3 days to avoid recontamination.  Advise if symptoms worsen and/or unresolved please follow-up with PCP or here  for further evaluation.     ED Prescriptions     Medication Sig Dispense Auth. Provider   predniSONE (STERAPRED UNI-PAK 21 TAB) 10 MG (21) TBPK  tablet Take by mouth daily. Take 6 tabs by mouth daily  for 2 days, then 5 tabs for 2 days, then 4 tabs for 2 days, then 3 tabs for 2 days, 2 tabs for 2 days, then 1 tab by mouth daily for 2 days 42 tablet Trevor Iha, FNP      PDMP not reviewed this encounter.   Trevor Iha, FNP 01/31/23 [phone]

## 2023-04-10 ENCOUNTER — Encounter: Payer: Self-pay | Admitting: Emergency Medicine

## 2023-04-10 ENCOUNTER — Ambulatory Visit
Admission: EM | Admit: 2023-04-10 | Discharge: 2023-04-10 | Disposition: A | Discharge status: Home / Self Care | Payer: BC Managed Care – PPO | Attending: Family Medicine | Admitting: Family Medicine

## 2023-04-10 DIAGNOSIS — K529 Noninfective gastroenteritis and colitis, unspecified: Secondary | ICD-10-CM

## 2023-04-10 LAB — POCT URINALYSIS DIP (MANUAL ENTRY)
Bilirubin, UA: NEGATIVE
Blood, UA: NEGATIVE
Glucose, UA: NEGATIVE mg/dL
Ketones, POC UA: NEGATIVE mg/dL
Nitrite, UA: POSITIVE — AB
Protein Ur, POC: NEGATIVE mg/dL
Spec Grav, UA: 1.015 (ref 1.010–1.025)
Urobilinogen, UA: 0.2 U/dL
pH, UA: 5.5 (ref 5.0–8.0)

## 2023-04-10 MED ORDER — SACCHAROMYCES BOULARDII 250 MG PO CAPS: 250.0000 mg | ORAL_CAPSULE | Freq: Two times a day (BID) | ORAL | 0 refills | Status: AC

## 2023-04-10 MED ORDER — AMOXICILLIN-POT CLAVULANATE 875-125 MG PO TABS: 1.0000 | ORAL_TABLET | Freq: Two times a day (BID) | ORAL | 0 refills | Status: DC

## 2023-04-10 NOTE — Discharge Instructions (Signed)
Take the Augmentin 2 times a day for 5 to 7 days.  May stop when you feel better Take the Florastor probiotic 2 times a day Avoid fried and spicy food for a few days.  Make sure you are drinking lots of water See Dr. Duanne Guess if not improving in a week

## 2023-04-10 NOTE — ED Provider Notes (Signed)
[facility]    CSN: [id number] Arrival date & time: 04/10/23  1422      History   Chief Complaint Chief Complaint  Patient presents with   Abdominal Pain    HPI Michelle Werner is a 67 y.o. female.   Patient is here for vague abdominal pain.  Is been present for about 10 days.  It is better in the morning and worse in the afternoon.  She states that she has frequent colonoscopies for colon polyps but does not know if any of her polyps are precancerous.  She has never been told she has any other colon disorder, colitis, or diverticular disease.  She has never had any abdominal surgeries.  The pain is in her lower abdomen.  No nausea or vomiting.  No fever or chills.  She states that she is having softer and more frequent bowel movements but not really diarrhea.  No urinary symptoms.  She does have a history of kidney stones 20 years ago with no recurrence.  Currently no flank pain, fever, nausea, or urinary symptoms    Past Medical History:  Diagnosis Date   Asthma     There are no problems to display for this patient.   Past Surgical History:  Procedure Laterality Date   ORIF WRIST FRACTURE Left 03/20/2020   Procedure: OPEN REDUCTION INTERNAL FIXATION (ORIF) WRIST FRACTURE;  Surgeon: Knute Neu, MD;  Location: [facility] OR;  Service: Plastics;  Laterality: Left;   TONSILLECTOMY      OB History   No obstetric history on file.      Home Medications    Prior to Admission medications   Medication Sig Start Date End Date Taking? Authorizing Provider  albuterol (VENTOLIN HFA) 108 (90 Base) MCG/ACT inhaler Inhale 1-2 puffs into the lungs every 6 (six) hours as needed for wheezing or shortness of breath. 06/13/20  Yes Walisiewicz, Kaitlyn E, PA-C  amoxicillin-clavulanate (AUGMENTIN) 875-125 MG tablet Take 1 tablet by mouth every 12 (twelve) hours. 04/10/23  Yes Eustace Moore, MD  levocetirizine (XYZAL) 5 MG tablet Take 5 mg by mouth at bedtime. 02/14/20  Yes  [provider]  montelukast (SINGULAIR) 10 MG tablet Take 10 mg by mouth at bedtime.   Yes [provider]  saccharomyces boulardii (FLORASTOR) 250 MG capsule Take 1 capsule (250 mg total) by mouth 2 (two) times daily. 04/10/23  Yes Eustace Moore, MD    Family History History reviewed. No pertinent family history.  Social History Social History   Tobacco Use   Smoking status: Never   Smokeless tobacco: Never  Vaping Use   Vaping status: Never Used  Substance Use Topics   Alcohol use: Not Currently   Drug use: Never     Allergies   Patient has no known allergies.   Review of Systems Review of Systems  See HPI Physical Exam Triage Vital Signs ED Triage Vitals  Encounter Vitals Group     BP 04/10/23 1433 125/82     Systolic BP Percentile --      Diastolic BP Percentile --      Pulse Rate 04/10/23 1433 80     Resp 04/10/23 1433 18     Temp 04/10/23 1433 98.7 F (37.1 C)     Temp Source 04/10/23 1433 Oral     SpO2 04/10/23 1433 96 %     Weight 04/10/23 1438 148 lb (67.1 kg)     Height 04/10/23 1438 5\' 4"  (1.626 m)  Head Circumference --      Peak Flow --      Pain Score 04/10/23 1436 8     Pain Loc --      Pain Education --      Exclude from Growth Chart --    No data found.  Updated Vital Signs BP 125/82 (BP Location: Right Arm)   Pulse 80   Temp 98.7 F (37.1 C) (Oral)   Resp 18   Ht 5\' 4"  (1.626 m)   Wt 67.1 kg   SpO2 96%   BMI 25.40 kg/m      Physical Exam Constitutional:      General: She is not in acute distress.    Appearance: She is well-developed and normal weight.  HENT:     Head: Normocephalic and atraumatic.  Eyes:     Conjunctiva/sclera: Conjunctivae normal.     Pupils: Pupils are equal, round, and reactive to light.  Cardiovascular:     Rate and Rhythm: Normal rate and regular rhythm.     Heart sounds: Normal heart sounds.  Pulmonary:     Effort: Pulmonary effort is normal. No respiratory distress.      Breath sounds: Normal breath sounds.  Abdominal:     General: Abdomen is flat. Bowel sounds are normal. There is no distension.     Palpations: Abdomen is soft. There is no hepatomegaly or splenomegaly.     Tenderness: There is abdominal tenderness in the right lower quadrant and left lower quadrant. There is no guarding or rebound. Positive signs include Murphy's sign. Negative signs include McBurney's sign.     Comments: Moderate tenderness to deep palpation in the left mid to lower abdomen.  No guarding or rebound.  No mass or organomegaly.  No supra pubic tenderness  Musculoskeletal:        General: Normal range of motion.     Cervical back: Normal range of motion.  Skin:    General: Skin is warm and dry.  Neurological:     Mental Status: She is alert.      UC Treatments / Results  Labs (all labs ordered are listed, but only abnormal results are displayed) Labs Reviewed  POCT URINALYSIS DIP (MANUAL ENTRY) - Abnormal; Notable for the following components:      Result Value   Color, UA orange (*)    Nitrite, UA Positive (*)    Leukocytes, UA Small (1+) (*)    All other components within normal limits  URINE CULTURE    EKG   Radiology No results found.  Procedures Procedures (including critical care time)  Medications Ordered in UC Medications - No data to display  Initial Impression / Assessment and Plan / UC Course  I have reviewed the triage vital signs and the nursing notes.  Pertinent labs & imaging results that were available during my care of the patient were reviewed by me and considered in my medical decision making (see chart for details).     Discussed with patient that her symptoms are rather vague.  10 days is a long time to have GI distress, however.  On the Sumption she is having the colitis I am going to give her some Augmentin.  Probiotics to protect her stomach.  Follow-up with PCP if additional testing indicated Final Clinical Impressions(s) / UC  Diagnoses   Final diagnoses:  Colitis presumed infectious     Discharge Instructions      Take the Augmentin 2 times a day for 5  to 7 days.  May stop when you feel better Take the Florastor probiotic 2 times a day Avoid fried and spicy food for a few days.  Make sure you are drinking lots of water See Dr. Duanne Guess if not improving in a week   ED Prescriptions     Medication Sig Dispense Auth. Provider   amoxicillin-clavulanate (AUGMENTIN) 875-125 MG tablet Take 1 tablet by mouth every 12 (twelve) hours. 14 tablet Eustace Moore, MD   saccharomyces boulardii (FLORASTOR) 250 MG capsule Take 1 capsule (250 mg total) by mouth 2 (two) times daily. 14 capsule Eustace Moore, MD      PDMP not reviewed this encounter.   Eustace Moore, MD 04/10/23 [phone]

## 2023-04-10 NOTE — ED Triage Notes (Signed)
Patient c/o RLQ and LLQ burning x 10 days at the same time that radiates around to her back.  Patient denies dysuria, urinary frequency.  Patient has noticed increased of loose stools today.  History of kidney stones x 20 years ago.  Started taking AZO and drinking Cranberry juice today.

## 2023-04-12 DIAGNOSIS — R5383 Other fatigue: Secondary | ICD-10-CM | POA: Diagnosis not present

## 2023-04-12 DIAGNOSIS — E785 Hyperlipidemia, unspecified: Secondary | ICD-10-CM | POA: Diagnosis not present

## 2023-04-12 DIAGNOSIS — E559 Vitamin D deficiency, unspecified: Secondary | ICD-10-CM | POA: Diagnosis not present

## 2023-04-12 DIAGNOSIS — Z13220 Encounter for screening for lipoid disorders: Secondary | ICD-10-CM | POA: Diagnosis not present

## 2023-04-13 ENCOUNTER — Telehealth: Payer: Self-pay | Admitting: Family Medicine

## 2023-04-13 LAB — URINE CULTURE: Culture: >=100000 — AB

## 2023-04-14 DIAGNOSIS — K529 Noninfective gastroenteritis and colitis, unspecified: Secondary | ICD-10-CM | POA: Diagnosis not present

## 2023-04-14 DIAGNOSIS — Z23 Encounter for immunization: Secondary | ICD-10-CM | POA: Diagnosis not present

## 2023-04-19 NOTE — Telephone Encounter (Signed)
done

## 2023-04-27 DIAGNOSIS — Z0000 Encounter for general adult medical examination without abnormal findings: Secondary | ICD-10-CM | POA: Diagnosis not present

## 2023-12-29 ENCOUNTER — Ambulatory Visit
Admission: EM | Admit: 2023-12-29 | Discharge: 2023-12-29 | Disposition: A | Discharge status: Home / Self Care | Attending: Family Medicine | Admitting: Family Medicine

## 2023-12-29 DIAGNOSIS — R062 Wheezing: Secondary | ICD-10-CM | POA: Diagnosis not present

## 2023-12-29 DIAGNOSIS — J069 Acute upper respiratory infection, unspecified: Secondary | ICD-10-CM | POA: Diagnosis not present

## 2023-12-29 MED ORDER — PREDNISONE 20 MG PO TABS: ORAL_TABLET | ORAL | 0 refills | Status: DC

## 2023-12-29 MED ORDER — AMOXICILLIN-POT CLAVULANATE 875-125 MG PO TABS: 1.0000 | ORAL_TABLET | Freq: Two times a day (BID) | ORAL | 0 refills | Status: DC

## 2023-12-29 NOTE — ED Triage Notes (Addendum)
 [facility] to [facility] with [facility] cough and uri since Monday. Pt has been taking tyelnol cold and sinus and musinex. Pmh asthma

## 2023-12-29 NOTE — ED Provider Notes (Signed)
 [facility]    CSN: [id number] Arrival date & time: 12/29/23  1837      History   Chief Complaint Chief Complaint  Patient presents with   Cough    HPI Michelle Werner is a 68 y.o. female.   HPI very pleasant 68 year old female presents with cough, wheezing, and chest congestion for 4 days.  PMH significant for asthma.  Past Medical History:  Diagnosis Date   Asthma     There are no active problems to display for this patient.   Past Surgical History:  Procedure Laterality Date   ORIF WRIST FRACTURE Left 03/20/2020   Procedure: OPEN REDUCTION INTERNAL FIXATION (ORIF) WRIST FRACTURE;  Surgeon: Lorretta Dess, MD;  Location: [facility] OR;  Service: Plastics;  Laterality: Left;   TONSILLECTOMY      OB History   No obstetric history on file.      Home Medications    Prior to Admission medications   Medication Sig Start Date End Date Taking? Authorizing Provider  amoxicillin -clavulanate (AUGMENTIN ) 875-125 MG tablet Take 1 tablet by mouth every 12 (twelve) hours. 12/29/23  Yes Teddy Sharper, FNP  predniSONE  (DELTASONE ) 20 MG tablet Take 3 tabs PO daily x 5 days. 12/29/23  Yes Teddy Sharper, FNP  albuterol  (VENTOLIN  HFA) 108 (90 Base) MCG/ACT inhaler Inhale 1-2 puffs into the lungs every 6 (six) hours as needed for wheezing or shortness of breath. 06/13/20   Walisiewicz, Kaitlyn E, PA-C  levocetirizine (XYZAL) 5 MG tablet Take 5 mg by mouth at bedtime. 02/14/20   [provider]  montelukast (SINGULAIR) 10 MG tablet Take 10 mg by mouth at bedtime.    [provider]  saccharomyces boulardii (FLORASTOR) 250 MG capsule Take 1 capsule (250 mg total) by mouth 2 (two) times daily. 04/10/23   Maranda Jamee Jacob, MD    Family History History reviewed. No pertinent family history.  Social History Social History   Tobacco Use   Smoking status: Never   Smokeless tobacco: Never  Vaping Use   Vaping status: Never Used  Substance Use Topics   Alcohol  use: Not Currently   Drug use: Never     Allergies   Patient has no known allergies.   Review of Systems Review of Systems  HENT:  Positive for congestion.   Respiratory:  Positive for cough and wheezing.   All other systems reviewed and are negative.    Physical Exam Triage Vital Signs ED Triage Vitals  Encounter Vitals Group     BP      Girls Systolic BP Percentile      Girls Diastolic BP Percentile      Boys Systolic BP Percentile      Boys Diastolic BP Percentile      Pulse      Resp      Temp      Temp src      SpO2      Weight      Height      Head Circumference      Peak Flow      Pain Score      Pain Loc      Pain Education      Exclude from Growth Chart    No data found.  Updated Vital Signs BP 135/76   Pulse 85   Temp 97.8 F (36.6 C)   Resp 16   SpO2 98%   Physical Exam Vitals and nursing note reviewed.  Constitutional:  Appearance: Normal appearance. She is normal weight.  HENT:     Head: Normocephalic and atraumatic.     Right Ear: Tympanic membrane, ear canal and external ear normal.     Left Ear: Tympanic membrane, ear canal and external ear normal.     Mouth/Throat:     Mouth: Mucous membranes are moist.     Pharynx: Oropharynx is clear.  Eyes:     Extraocular Movements: Extraocular movements intact.     Conjunctiva/sclera: Conjunctivae normal.     Pupils: Pupils are equal, round, and reactive to light.  Cardiovascular:     Rate and Rhythm: Normal rate and regular rhythm.     Pulses: Normal pulses.     Heart sounds: Normal heart sounds.  Pulmonary:     Effort: Pulmonary effort is normal.     Breath sounds: Wheezing present. No rhonchi or rales.     Comments: Mild inspiratory wheeze noted throughout, infrequent nonproductive cough on exam Musculoskeletal:        General: Normal range of motion.  Skin:    General: Skin is warm and dry.  Neurological:     General: No focal deficit present.     Mental Status: She is alert  and oriented to person, place, and time. Mental status is at baseline.  Psychiatric:        Mood and Affect: Mood normal.        Behavior: Behavior normal.      UC Treatments / Results  Labs (all labs ordered are listed, but only abnormal results are displayed) Labs Reviewed - No data to display  EKG   Radiology No results found.  Procedures Procedures (including critical care time)  Medications Ordered in UC Medications - No data to display  Initial Impression / Assessment and Plan / UC Course  I have reviewed the triage vital signs and the nursing notes.  Pertinent labs & imaging results that were available during my care of the patient were reviewed by me and considered in my medical decision making (see chart for details).     MDM: 1.  Acute URI-Rx'd Augmentin  875/125 mg tablet: Take 1 tablet twice daily x 7 days; 2.  Wheezes-Rx prednisone  20 mg tablet: Take 3 tablets daily x 5 days. Advised patient to take medications as directed with food to completion.  Advised patient to take prednisone  with first dose of Augmentin  for the next 5 of 7 days.  Encouraged increase daily water intake 64 ounces per day while taking these medications.  Advised if symptoms worsen and/or unresolved please follow with your PCP or here for further evaluation.  Patient discharged home, hemodynamically stable Final Clinical Impressions(s) / UC Diagnoses   Final diagnoses:  Acute URI  Wheezes     Discharge Instructions      Advised patient to take medications as directed with food to completion.  Advised patient to take prednisone  with first dose of Augmentin  for the next 5 of 7 days.  Encouraged increase daily water intake 64 ounces per day while taking these medications.  Advised if symptoms worsen and/or unresolved please follow with your PCP or here for further evaluation.     ED Prescriptions     Medication Sig Dispense Auth. Provider   amoxicillin -clavulanate (AUGMENTIN ) 875-125  MG tablet Take 1 tablet by mouth every 12 (twelve) hours. 14 tablet Zaela Graley, FNP   predniSONE  (DELTASONE ) 20 MG tablet Take 3 tabs PO daily x 5 days. 15 tablet Dabria Wadas, FNP  PDMP not reviewed this encounter.   Teddy Sharper, FNP 12/29/23 1949

## 2023-12-29 NOTE — Discharge Instructions (Addendum)
 Advised patient to take medications as directed with food to completion.  Advised patient to take prednisone  with first dose of Augmentin  for the next 5 of 7 days.  Encouraged increase daily water intake 64 ounces per day while taking these medications.  Advised if symptoms worsen and/or unresolved please follow with your PCP or here for further evaluation.

## 2024-07-01 ENCOUNTER — Ambulatory Visit
Admission: EM | Admit: 2024-07-01 | Discharge: 2024-07-01 | Disposition: A | Discharge status: Home / Self Care | Attending: Family Medicine | Admitting: Family Medicine

## 2024-07-01 ENCOUNTER — Encounter: Payer: Self-pay | Admitting: Emergency Medicine

## 2024-07-01 DIAGNOSIS — N3001 Acute cystitis with hematuria: Secondary | ICD-10-CM | POA: Diagnosis not present

## 2024-07-01 LAB — POCT URINE DIPSTICK
Bilirubin, UA: NEGATIVE
Glucose, UA: NEGATIVE mg/dL
Ketones, POC UA: NEGATIVE mg/dL
Nitrite, UA: NEGATIVE
POC PROTEIN,UA: NEGATIVE
Spec Grav, UA: 1.01
Urobilinogen, UA: 0.2 U/dL
pH, UA: 5.5

## 2024-07-01 MED ORDER — CEFDINIR 300 MG PO CAPS: 300.0000 mg | ORAL_CAPSULE | Freq: Two times a day (BID) | ORAL | 0 refills | Status: AC

## 2024-07-01 NOTE — Discharge Instructions (Addendum)
 Advised patient take medication as directed with food to completion.  Encouraged to increase daily water intake to 64 ounces per day while taking this medication.  Advised we will follow-up with urine culture results once received.  Advised if symptoms worsen and/or unresolved please follow-up with your PCP or here for further evaluation.

## 2024-07-01 NOTE — ED Provider Notes (Signed)
 [facility]    CSN: [id number] Arrival date & time: 07/01/24  1514      History   Chief Complaint Chief Complaint  Patient presents with   Back Pain    HPI AREONA HOMER is a 69 y.o. female.   HPI  Past Medical History:  Diagnosis Date   Asthma     There are no active problems to display for this patient.   Past Surgical History:  Procedure Laterality Date   ORIF WRIST FRACTURE Left 03/20/2020   Procedure: OPEN REDUCTION INTERNAL FIXATION (ORIF) WRIST FRACTURE;  Surgeon: Lorretta Dess, MD;  Location: [facility] OR;  Service: Plastics;  Laterality: Left;   TONSILLECTOMY      OB History   No obstetric history on file.      Home Medications    Prior to Admission medications  Medication Sig Start Date End Date Taking? Authorizing Provider  albuterol  (VENTOLIN  HFA) 108 (90 Base) MCG/ACT inhaler Inhale 1-2 puffs into the lungs every 6 (six) hours as needed for wheezing or shortness of breath. 06/13/20  Yes Walisiewicz, Kaitlyn E, PA-C  cefdinir  (OMNICEF ) 300 MG capsule Take 1 capsule (300 mg total) by mouth 2 (two) times daily for 7 days. 07/01/24 07/08/24 Yes Teddy Sharper, FNP  levocetirizine (XYZAL) 5 MG tablet Take 5 mg by mouth at bedtime. 02/14/20  Yes [provider]  montelukast (SINGULAIR) 10 MG tablet Take 10 mg by mouth at bedtime.   Yes [provider]  saccharomyces boulardii (FLORASTOR) 250 MG capsule Take 1 capsule (250 mg total) by mouth 2 (two) times daily. 04/10/23  Yes Maranda Jamee Jacob, MD    Family History History reviewed. No pertinent family history.  Social History Social History[1]   Allergies   Patient has no known allergies.   Review of Systems Review of Systems  Musculoskeletal:  Positive for back pain.     Physical Exam Triage Vital Signs ED Triage Vitals  Encounter Vitals Group     BP      Girls Systolic BP Percentile      Girls Diastolic BP Percentile      Boys Systolic BP Percentile      Boys  Diastolic BP Percentile      Pulse      Resp      Temp      Temp src      SpO2      Weight      Height      Head Circumference      Peak Flow      Pain Score      Pain Loc      Pain Education      Exclude from Growth Chart    No data found.  Updated Vital Signs BP 139/82 (BP Location: Right Arm)   Pulse 69   Temp 98.9 F (37.2 C) (Oral)   Resp 18   Ht 5' 4 (1.626 m)   Wt 140 lb (63.5 kg)   SpO2 97%   BMI 24.03 kg/m   Visual Acuity Right Eye Distance:   Left Eye Distance:   Bilateral Distance:    Right Eye Near:   Left Eye Near:    Bilateral Near:     Physical Exam Vitals and nursing note reviewed.  Constitutional:      Appearance: Normal appearance. She is normal weight.  HENT:     Head: Normocephalic and atraumatic.     Mouth/Throat:     Mouth:  Mucous membranes are moist.     Pharynx: Oropharynx is clear.  Eyes:     Extraocular Movements: Extraocular movements intact.     Conjunctiva/sclera: Conjunctivae normal.     Pupils: Pupils are equal, round, and reactive to light.  Cardiovascular:     Rate and Rhythm: Normal rate and regular rhythm.     Heart sounds: Normal heart sounds.  Pulmonary:     Effort: Pulmonary effort is normal.     Breath sounds: Normal breath sounds. No wheezing, rhonchi or rales.  Abdominal:     Tenderness: There is no right CVA tenderness or left CVA tenderness.  Musculoskeletal:        General: Normal range of motion.  Skin:    General: Skin is warm and dry.  Neurological:     General: No focal deficit present.     Mental Status: She is alert and oriented to person, place, and time. Mental status is at baseline.  Psychiatric:        Mood and Affect: Mood normal.        Behavior: Behavior normal.      UC Treatments / Results  Labs (all labs ordered are listed, but only abnormal results are displayed) Labs Reviewed  POCT URINE DIPSTICK - Abnormal; Notable for the following components:      Result Value   Blood, UA  trace-lysed (*)    Leukocytes, UA Trace (*)    All other components within normal limits  URINE CULTURE    EKG   Radiology No results found.  Procedures Procedures (including critical care time)  Medications Ordered in UC Medications - No data to display  Initial Impression / Assessment and Plan / UC Course  I have reviewed the triage vital signs and the nursing notes.  Pertinent labs & imaging results that were available during my care of the patient were reviewed by me and considered in my medical decision making (see chart for details).     MDM: 1.  Acute cystitis with hematuria-UA, urine culture ordered, Rx'd cefdinir  300 mg capsule: Take 1 capsule twice daily x 7 days. Advised patient take medication as directed with food to completion.  Encouraged to increase daily water intake to 64 ounces per day while taking this medication.  Advised we will follow-up with urine culture results once received.  Advised if symptoms worsen and/or unresolved please follow-up with your PCP or here for further evaluation.  Final Clinical Impressions(s) / UC Diagnoses   Final diagnoses:  Acute cystitis with hematuria     Discharge Instructions      Advised patient take medication as directed with food to completion.  Encouraged to increase daily water intake to 64 ounces per day while taking this medication.  Advised we will follow-up with urine culture results once received.  Advised if symptoms worsen and/or unresolved please follow-up with your PCP or here for further evaluation.     ED Prescriptions     Medication Sig Dispense Auth. Provider   cefdinir  (OMNICEF ) 300 MG capsule Take 1 capsule (300 mg total) by mouth 2 (two) times daily for 7 days. 14 capsule Kharizma Lesnick, FNP      PDMP not reviewed this encounter.     [1]  Social History Tobacco Use   Smoking status: Never   Smokeless tobacco: Never  Vaping Use   Vaping status: Never Used  Substance Use Topics    Alcohol use: Not Currently   Drug use: Never  Teddy Sharper, FNP 07/01/24 1649  "

## 2024-07-01 NOTE — ED Triage Notes (Signed)
 Patient c/o right lower groin pain and right lower back pain x 2 weeks.  Patient denies any dysuria but abdominal pain and pressure until she has emptied her bladder.  Patient has taken AZO this am.

## 2024-07-03 LAB — URINE CULTURE

## 2024-07-05 ENCOUNTER — Ambulatory Visit (HOSPITAL_COMMUNITY): Payer: Self-pay

## 2024-07-16 ENCOUNTER — Ambulatory Visit
Admission: EM | Admit: 2024-07-16 | Discharge: 2024-07-16 | Disposition: A | Discharge status: Home / Self Care | Attending: Family Medicine | Admitting: Family Medicine

## 2024-07-16 ENCOUNTER — Encounter: Payer: Self-pay | Admitting: Emergency Medicine

## 2024-07-16 DIAGNOSIS — T7840XA Allergy, unspecified, initial encounter: Secondary | ICD-10-CM | POA: Diagnosis not present

## 2024-07-16 MED ORDER — PREDNISONE 50 MG PO TABS: ORAL_TABLET | ORAL | 0 refills | Status: AC

## 2024-07-16 NOTE — ED Triage Notes (Signed)
 Patient states that her face feels like it's on fire, left eye swelling this morning.  Patient states that she got a new perfume and sprayed it on her for the first time today.  Face is hot.  Washed her face and applied no itch gel on face.

## 2024-07-16 NOTE — Discharge Instructions (Addendum)
 Advised patient take medication as directed with food to completion.  Encouraged increase daily water intake to 64 ounces per day while taking this medication.  Advised if symptoms worsen and/or unresolved please follow-up with your PCP or here for further evaluation.

## 2024-07-16 NOTE — ED Provider Notes (Signed)
 " Michelle Werner    CSN: [id number] Arrival date & time: 07/16/24  1516      History   Chief Complaint Chief Complaint  Patient presents with   Allergic Reaction    HPI Michelle Werner is a 69 y.o. female.   HPI Pleasant 69 year old female presents with allergic reaction to her perfume this morning at 4 AM.  She reports noticing redness of face and left eyelid swelling.  Reports washing her face immediately afterwards and applied no itch gel.  Ports works at [employer] and had to be at work this morning.  PMH significant for asthma  Past Medical History:  Diagnosis Date   Asthma     There are no active problems to display for this patient.   Past Surgical History:  Procedure Laterality Date   ORIF WRIST FRACTURE Left 03/20/2020   Procedure: OPEN REDUCTION INTERNAL FIXATION (ORIF) WRIST FRACTURE;  Surgeon: Lorretta Dess, MD;  Location: [facility] OR;  Service: Plastics;  Laterality: Left;   TONSILLECTOMY      OB History   No obstetric history on file.      Home Medications    Prior to Admission medications  Medication Sig Start Date End Date Taking? Authorizing Provider  albuterol  (VENTOLIN  HFA) 108 (90 Base) MCG/ACT inhaler Inhale 1-2 puffs into the lungs every 6 (six) hours as needed for wheezing or shortness of breath. 06/13/20  Yes Walisiewicz, Kaitlyn E, PA-C  levocetirizine (XYZAL) 5 MG tablet Take 5 mg by mouth at bedtime. 02/14/20  Yes [provider]  montelukast (SINGULAIR) 10 MG tablet Take 10 mg by mouth at bedtime.   Yes [provider]  predniSONE  (DELTASONE ) 50 MG tablet Take 1 tab p.o. daily for 5 days. 07/16/24  Yes Teddy Sharper, FNP  saccharomyces boulardii (FLORASTOR) 250 MG capsule Take 1 capsule (250 mg total) by mouth 2 (two) times daily. 04/10/23  Yes Michelle Jamee Jacob, MD    Family History History reviewed. No pertinent family history.  Social History Social History[1]   Allergies   Patient has no known  allergies.   Review of Systems Review of Systems  Skin:  Positive for rash.     Physical Exam Triage Vital Signs ED Triage Vitals  Encounter Vitals Group     BP      Girls Systolic BP Percentile      Girls Diastolic BP Percentile      Boys Systolic BP Percentile      Boys Diastolic BP Percentile      Pulse      Resp      Temp      Temp src      SpO2      Weight      Height      Head Circumference      Peak Flow      Pain Score      Pain Loc      Pain Education      Exclude from Growth Chart    No data found.  Updated Vital Signs BP 136/79 (BP Location: Right Arm)   Pulse 83   Temp 98.3 F (36.8 C) (Oral)   Resp 18   Ht 5' 4 (1.626 m)   Wt 142 lb (64.4 kg)   SpO2 96%   BMI 24.37 kg/m   Visual Acuity Right Eye Distance:   Left Eye Distance:   Bilateral Distance:    Right Eye Near:   Left Eye Near:  Bilateral Near:     Physical Exam Vitals and nursing note reviewed.  Constitutional:      Appearance: Normal appearance. She is normal weight.  HENT:     Head: Normocephalic and atraumatic.     Right Ear: Tympanic membrane, ear canal and external ear normal.     Left Ear: Tympanic membrane, ear canal and external ear normal.     Mouth/Throat:     Mouth: Mucous membranes are moist.     Pharynx: Oropharynx is clear.  Eyes:     Extraocular Movements: Extraocular movements intact.     Conjunctiva/sclera: Conjunctivae normal.     Pupils: Pupils are equal, round, and reactive to light.  Cardiovascular:     Rate and Rhythm: Normal rate and regular rhythm.     Heart sounds: Normal heart sounds.  Pulmonary:     Effort: Pulmonary effort is normal.     Breath sounds: Normal breath sounds. No wheezing, rhonchi or rales.  Musculoskeletal:        General: Normal range of motion.  Skin:    General: Skin is warm and dry.     Comments: Face: Mild erythematous rash noted-please see image below  Neurological:     General: No focal deficit present.     Mental  Status: She is alert and oriented to person, place, and time. Mental status is at baseline.      UC Treatments / Results  Labs (all labs ordered are listed, but only abnormal results are displayed) Labs Reviewed - No data to display  EKG   Radiology No results found.  Procedures Procedures (including critical Werner time)  Medications Ordered in UC Medications - No data to display  Initial Impression / Assessment and Plan / UC Course  I have reviewed the triage vital signs and the nursing notes.  Pertinent labs & imaging results that were available during my Werner of the patient were reviewed by me and considered in my medical decision making (see chart for details).     MDM: 1.  Allergic reaction, initial encounter-Rx'd prednisone  50 mg tablet: Take 1 tablet daily x 5 days. Advised patient take medication as directed with food to completion.  Encouraged increase daily water intake to 64 ounces per day while taking this medication.  Advised if symptoms worsen and/or unresolved please follow-up with your PCP or here for further evaluation.  Patient discharged home, hemodynamically stable Final Clinical Impressions(s) / UC Diagnoses   Final diagnoses:  Allergic reaction, initial encounter     Discharge Instructions      Advised patient take medication as directed with food to completion.  Encouraged increase daily water intake to 64 ounces per day while taking this medication.  Advised if symptoms worsen and/or unresolved please follow-up with your PCP or here for further evaluation.     ED Prescriptions     Medication Sig Dispense Auth. Provider   predniSONE  (DELTASONE ) 50 MG tablet Take 1 tab p.o. daily for 5 days. 5 tablet Sanita Estrada, FNP      PDMP not reviewed this encounter.     [1]  Social History Tobacco Use   Smoking status: Never   Smokeless tobacco: Never  Vaping Use   Vaping status: Never Used  Substance Use Topics   Alcohol use: Not Currently    Drug use: Never     Teddy Sharper, FNP 07/16/24 1658  "
